# Patient Record
Sex: Female | Born: 1990 | Race: Black or African American | Hispanic: No | Marital: Single | Smoking: Former smoker
Health system: Southern US, Community
[De-identification: ages and names within clinical notes are randomized; demographics above are authoritative.]

## PROBLEM LIST (undated history)

## (undated) ENCOUNTER — Inpatient Hospital Stay (HOSPITAL_COMMUNITY): Payer: Self-pay

## (undated) DIAGNOSIS — R51 Headache: Secondary | ICD-10-CM

## (undated) DIAGNOSIS — G43909 Migraine, unspecified, not intractable, without status migrainosus: Secondary | ICD-10-CM

## (undated) DIAGNOSIS — R011 Cardiac murmur, unspecified: Secondary | ICD-10-CM

## (undated) DIAGNOSIS — F32A Depression, unspecified: Secondary | ICD-10-CM

## (undated) DIAGNOSIS — D649 Anemia, unspecified: Secondary | ICD-10-CM

## (undated) DIAGNOSIS — F329 Major depressive disorder, single episode, unspecified: Secondary | ICD-10-CM

## (undated) DIAGNOSIS — A599 Trichomoniasis, unspecified: Secondary | ICD-10-CM

## (undated) HISTORY — PX: INDUCED ABORTION: SHX677

---
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
## 2010-07-17 ENCOUNTER — Emergency Department (HOSPITAL_COMMUNITY): Admission: EM | Admit: 2010-07-17 | Discharge: 2010-07-17 | Payer: Self-pay | Admitting: Emergency Medicine

## 2010-12-31 ENCOUNTER — Inpatient Hospital Stay (INDEPENDENT_AMBULATORY_CARE_PROVIDER_SITE_OTHER)
Admission: RE | Admit: 2010-12-31 | Discharge: 2010-12-31 | Disposition: A | Discharge status: Home / Self Care | Payer: Medicaid Other | Source: Ambulatory Visit | Attending: Family Medicine | Admitting: Family Medicine

## 2010-12-31 DIAGNOSIS — N898 Other specified noninflammatory disorders of vagina: Secondary | ICD-10-CM

## 2010-12-31 LAB — POCT URINALYSIS DIPSTICK
Glucose, UA: NEGATIVE mg/dL
Specific Gravity, Urine: >=1.03 (ref 1.005–1.030)
Urobilinogen, UA: 1 mg/dL (ref 0.0–1.0)

## 2010-12-31 LAB — POCT PREGNANCY, URINE: Preg Test, Ur: NEGATIVE

## 2010-12-31 LAB — POCT I-STAT, CHEM 8
BUN: 11 mg/dL (ref 6–23)
Creatinine, Ser: 0.8 mg/dL (ref 0.4–1.2)
Hemoglobin: 13.3 g/dL (ref 12.0–15.0)
Potassium: 3.2 mEq/L — ABNORMAL LOW (ref 3.5–5.1)
Sodium: 139 mEq/L (ref 135–145)
TCO2: 23 mmol/L (ref 0–100)

## 2011-04-19 ENCOUNTER — Inpatient Hospital Stay (INDEPENDENT_AMBULATORY_CARE_PROVIDER_SITE_OTHER)
Admission: RE | Admit: 2011-04-19 | Discharge: 2011-04-19 | Disposition: A | Discharge status: Home / Self Care | Payer: Self-pay | Source: Ambulatory Visit | Attending: Emergency Medicine | Admitting: Emergency Medicine

## 2011-04-19 DIAGNOSIS — J45909 Unspecified asthma, uncomplicated: Secondary | ICD-10-CM

## 2011-11-06 ENCOUNTER — Encounter (HOSPITAL_COMMUNITY): Payer: Self-pay | Admitting: *Deleted

## 2011-11-06 ENCOUNTER — Inpatient Hospital Stay (HOSPITAL_COMMUNITY)
Admission: AD | Admit: 2011-11-06 | Discharge: 2011-11-06 | Disposition: A | Discharge status: Home / Self Care | Payer: Self-pay | Source: Ambulatory Visit | Attending: Obstetrics & Gynecology | Admitting: Obstetrics & Gynecology

## 2011-11-06 ENCOUNTER — Inpatient Hospital Stay (HOSPITAL_COMMUNITY): Payer: Self-pay

## 2011-11-06 DIAGNOSIS — O360990 Maternal care for other rhesus isoimmunization, unspecified trimester, not applicable or unspecified: Secondary | ICD-10-CM

## 2011-11-06 DIAGNOSIS — O209 Hemorrhage in early pregnancy, unspecified: Secondary | ICD-10-CM | POA: Insufficient documentation

## 2011-11-06 DIAGNOSIS — Z298 Encounter for other specified prophylactic measures: Secondary | ICD-10-CM | POA: Insufficient documentation

## 2011-11-06 DIAGNOSIS — Z2989 Encounter for other specified prophylactic measures: Secondary | ICD-10-CM | POA: Insufficient documentation

## 2011-11-06 DIAGNOSIS — Z6791 Unspecified blood type, Rh negative: Secondary | ICD-10-CM

## 2011-11-06 HISTORY — DX: Cardiac murmur, unspecified: R01.1

## 2011-11-06 HISTORY — DX: Major depressive disorder, single episode, unspecified: F32.9

## 2011-11-06 HISTORY — DX: Depression, unspecified: F32.A

## 2011-11-06 HISTORY — DX: Anemia, unspecified: D64.9

## 2011-11-06 HISTORY — DX: Headache: R51

## 2011-11-06 LAB — URINALYSIS, ROUTINE W REFLEX MICROSCOPIC
Hgb urine dipstick: NEGATIVE
Leukocytes, UA: NEGATIVE
Protein, ur: NEGATIVE mg/dL
Specific Gravity, Urine: >1.03 — ABNORMAL HIGH (ref 1.005–1.030)
Urobilinogen, UA: 1 mg/dL (ref 0.0–1.0)

## 2011-11-06 LAB — RH IG WORKUP (INCLUDES ABO/RH)
ABO/RH(D): AB NEG
Antibody Screen: NEGATIVE
Gestational Age(Wks): 5
Unit division: 0

## 2011-11-06 LAB — POCT PREGNANCY, URINE: Preg Test, Ur: POSITIVE

## 2011-11-06 LAB — CBC
MCHC: 34 g/dL (ref 30.0–36.0)
Platelets: 201 10*3/uL (ref 150–400)
RDW: 13 % (ref 11.5–15.5)
WBC: 5.1 10*3/uL (ref 4.0–10.5)

## 2011-11-06 LAB — HCG, QUANTITATIVE, PREGNANCY: hCG, Beta Chain, Quant, S: 4532 m[IU]/mL — ABNORMAL HIGH (ref <5)

## 2011-11-06 LAB — WET PREP, GENITAL: Yeast Wet Prep HPF POC: NONE SEEN

## 2011-11-06 LAB — ABO/RH: ABO/RH(D): AB NEG

## 2011-11-06 MED ORDER — RHO D IMMUNE GLOBULIN 1500 UNIT/2ML IJ SOLN
300.0000 ug | Freq: Once | INTRAMUSCULAR | Status: AC
  Administered 2011-11-06: 300 ug via INTRAMUSCULAR
  Filled 2011-11-06: qty 2

## 2011-11-06 NOTE — ED Notes (Signed)
No problem with injection

## 2011-11-06 NOTE — ED Provider Notes (Signed)
Attestation of Attending Supervision of Advanced Practitioner: Evaluation and management procedures were performed by the PA/NP/CNM/OB Fellow under my supervision/collaboration. Chart reviewed, and agree with management and plan.  Elwanda Moger, M.D. 11/06/2011 3:35 PM   

## 2011-11-06 NOTE — ED Notes (Signed)
rhophyllac handouts given and discussed with pt and S.O.

## 2011-11-06 NOTE — ED Provider Notes (Signed)
History     Chief Complaint  Patient presents with  . Dysmenorrhea   HPI Pt is early pregnant~4weeks with LMP 12/14 after 3 positive home pregnancy tests.  She had intercourse last night and spotting this morning with some mild cramping.  She denies other complaints. She is G2P0TAB1 with Rh negative- pt states she had a "shot" with her TAB.  Pt denies other vaginal discharge, constipation, diarrhea, nausea, vomiting, chills fever or headaches.    No past medical history on file.  History reviewed. No pertinent past surgical history.  No family history on file.  History  Substance Use Topics  . Smoking status: Not on file  . Smokeless tobacco: Not on file  . Alcohol Use: Not on file    Allergies: Allergies not on file  No prescriptions prior to admission    Review of Systems  Constitutional: Negative for fever and chills.  Respiratory: Negative for cough.   Gastrointestinal: Positive for abdominal pain. Negative for nausea, vomiting, diarrhea and constipation.  Genitourinary: Negative for dysuria and urgency.  Skin: Negative for rash.  Neurological: Negative for headaches.   Physical Exam   Blood pressure 108/66, pulse 72, temperature 98.7 F (37.1 C), temperature source Oral, resp. rate 16, height 5' 6.25" (1.683 m), weight 149 lb 4 oz (67.699 kg), last menstrual period 10/09/2011.  Physical Exam  Vitals reviewed. Constitutional: She is oriented to person, place, and time. She appears well-developed and well-nourished.  HENT:  Head: Normocephalic.  Eyes: Pupils are equal, round, and reactive to light.  Neck: Normal range of motion. Neck supple.  Cardiovascular: Normal rate.   Respiratory: Effort normal.  GI: Soft. She exhibits no distension. There is no tenderness. There is no rebound and no guarding.  Genitourinary:       Mod amount of opaque pink discharge in vault; cervix clean, closed; uterus ant deviated to right NSSC; adnexal without palpable enlargement or  tenderness  Musculoskeletal: Normal range of motion.  Neurological: She is alert and oriented to person, place, and time.  Skin: Skin is warm and dry.  Psychiatric: She has a normal mood and affect.    MAU Course  Procedures Results for orders placed during the hospital encounter of 11/06/11 (from the past 24 hour(s))  URINALYSIS, ROUTINE W REFLEX MICROSCOPIC     Status: Abnormal   Collection Time   11/06/11 11:43 AM      Component Value Range   Color, Urine YELLOW  YELLOW    APPearance CLEAR  CLEAR    Specific Gravity, Urine >1.030 (*) 1.005 - 1.030    pH 6.0  5.0 - 8.0    Glucose, UA NEGATIVE  NEGATIVE (mg/dL)   Hgb urine dipstick NEGATIVE  NEGATIVE    Bilirubin Urine NEGATIVE  NEGATIVE    Ketones, ur NEGATIVE  NEGATIVE (mg/dL)   Protein, ur NEGATIVE  NEGATIVE (mg/dL)   Urobilinogen, UA 1.0  0.0 - 1.0 (mg/dL)   Nitrite NEGATIVE  NEGATIVE    Leukocytes, UA NEGATIVE  NEGATIVE   POCT PREGNANCY, URINE     Status: Normal   Collection Time   11/06/11 11:54 AM      Component Value Range   Preg Test, Ur POSITIVE    CBC     Status: Abnormal   Collection Time   11/06/11 12:00 PM      Component Value Range   WBC 5.1  4.0 - 10.5 (K/uL)   RBC 4.10  3.87 - 5.11 (MIL/uL)   Hemoglobin 12.1  12.0 - 15.0 (g/dL)   HCT 16.1 (*) 09.6 - 46.0 (%)   MCV 86.8  78.0 - 100.0 (fL)   MCH 29.5  26.0 - 34.0 (pg)   MCHC 34.0  30.0 - 36.0 (g/dL)   RDW 04.5  40.9 - 81.1 (%)   Platelets 201  150 - 400 (K/uL)  ABO/RH     Status: Normal   Collection Time   11/06/11 12:00 PM      Component Value Range   ABO/RH(D) AB NEG    HCG, QUANTITATIVE, PREGNANCY     Status: Abnormal   Collection Time   11/06/11 12:00 PM      Component Value Range   hCG, Beta Chain, Quant, S 4532 (*) <5 (mIU/mL)  RH IG WORKUP, [facility]     Status: Normal (Preliminary result)   Collection Time   11/06/11 12:10 PM      Component Value Range   Gestational Age(Wks) 5     ABO/RH(D) AB NEG     Antibody Screen NEG     Unit  Number [id number]     Blood Component Type RHIG     Unit division 00     Status of Unit ISSUED     Transfusion Status OK TO TRANSFUSE    WET PREP, GENITAL     Status: Abnormal   Collection Time   11/06/11 12:50 PM      Component Value Range   Yeast, Wet Prep NONE SEEN  NONE SEEN    Trich, Wet Prep NONE SEEN  NONE SEEN    Clue Cells, Wet Prep NONE SEEN  NONE SEEN    WBC, Wet Prep HPF POC FEW (*) NONE SEEN   ultrasound showed [redacted]w[redacted]d GS with YS no FP yet Rh negative- Rhogam given   Assessment and Plan  Bleeding in early pregnancy Rh Neg with RHogam given F/u for prenatal care  Salik Grewell 11/06/2011, 12:53 PM

## 2011-11-06 NOTE — Progress Notes (Signed)
Period is about due, had some cramping and spotting this morning, had done  Home preg tests last Sat, Sun and today - all were positive, wondering if positive

## 2011-11-06 NOTE — Progress Notes (Signed)
Pt states here for pregnancy confirmation, noted spotting today. Had 2 positive upt's at home.

## 2012-11-23 ENCOUNTER — Encounter (HOSPITAL_COMMUNITY): Payer: Self-pay | Admitting: Family Medicine

## 2012-11-23 ENCOUNTER — Emergency Department (HOSPITAL_COMMUNITY)
Admission: EM | Admit: 2012-11-23 | Discharge: 2012-11-23 | Disposition: A | Discharge status: Home / Self Care | Payer: Self-pay | Attending: Emergency Medicine | Admitting: Emergency Medicine

## 2012-11-23 DIAGNOSIS — J45909 Unspecified asthma, uncomplicated: Secondary | ICD-10-CM | POA: Insufficient documentation

## 2012-11-23 DIAGNOSIS — S335XXA Sprain of ligaments of lumbar spine, initial encounter: Secondary | ICD-10-CM | POA: Insufficient documentation

## 2012-11-23 DIAGNOSIS — S39012A Strain of muscle, fascia and tendon of lower back, initial encounter: Secondary | ICD-10-CM

## 2012-11-23 DIAGNOSIS — Z87891 Personal history of nicotine dependence: Secondary | ICD-10-CM | POA: Insufficient documentation

## 2012-11-23 DIAGNOSIS — Z862 Personal history of diseases of the blood and blood-forming organs and certain disorders involving the immune mechanism: Secondary | ICD-10-CM | POA: Insufficient documentation

## 2012-11-23 DIAGNOSIS — R011 Cardiac murmur, unspecified: Secondary | ICD-10-CM | POA: Insufficient documentation

## 2012-11-23 DIAGNOSIS — Y9389 Activity, other specified: Secondary | ICD-10-CM | POA: Insufficient documentation

## 2012-11-23 DIAGNOSIS — Y92410 Unspecified street and highway as the place of occurrence of the external cause: Secondary | ICD-10-CM | POA: Insufficient documentation

## 2012-11-23 DIAGNOSIS — Z8659 Personal history of other mental and behavioral disorders: Secondary | ICD-10-CM | POA: Insufficient documentation

## 2012-11-23 DIAGNOSIS — Z8669 Personal history of other diseases of the nervous system and sense organs: Secondary | ICD-10-CM | POA: Insufficient documentation

## 2012-11-23 DIAGNOSIS — Z79899 Other long term (current) drug therapy: Secondary | ICD-10-CM | POA: Insufficient documentation

## 2012-11-23 MED ORDER — IBUPROFEN 400 MG PO TABS
800.0000 mg | ORAL_TABLET | Freq: Once | ORAL | Status: AC
  Administered 2012-11-23: 800 mg via ORAL
  Filled 2012-11-23: qty 2

## 2012-11-23 MED ORDER — IBUPROFEN 800 MG PO TABS: 800.0000 mg | ORAL_TABLET | Freq: Three times a day (TID) | ORAL | Status: DC

## 2012-11-23 MED ORDER — CYCLOBENZAPRINE HCL 10 MG PO TABS
10.0000 mg | ORAL_TABLET | Freq: Once | ORAL | Status: AC
  Administered 2012-11-23: 10 mg via ORAL
  Filled 2012-11-23: qty 1

## 2012-11-23 MED ORDER — CYCLOBENZAPRINE HCL 10 MG PO TABS: 10.0000 mg | ORAL_TABLET | Freq: Two times a day (BID) | ORAL | Status: DC | PRN
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
## 2012-11-23 NOTE — ED Provider Notes (Signed)
Medical screening examination/treatment/procedure(s) were performed by non-physician practitioner and as supervising physician I was immediately available for consultation/collaboration.   Glynn Octave, MD 11/23/12 1859
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
## 2013-10-20 ENCOUNTER — Encounter (HOSPITAL_COMMUNITY): Payer: Self-pay | Admitting: *Deleted

## 2013-10-20 ENCOUNTER — Inpatient Hospital Stay (HOSPITAL_COMMUNITY)
Admission: AD | Admit: 2013-10-20 | Discharge: 2013-10-20 | Disposition: A | Discharge status: Home / Self Care | Payer: BC Managed Care – PPO | Source: Ambulatory Visit | Attending: Obstetrics & Gynecology | Admitting: Obstetrics & Gynecology

## 2013-10-20 DIAGNOSIS — Z87898 Personal history of other specified conditions: Secondary | ICD-10-CM

## 2013-10-20 DIAGNOSIS — Z3202 Encounter for pregnancy test, result negative: Secondary | ICD-10-CM | POA: Insufficient documentation

## 2013-10-20 LAB — CBC WITH DIFFERENTIAL/PLATELET
Basophils Absolute: 0 10*3/uL (ref 0.0–0.1)
HCT: 35.7 % — ABNORMAL LOW (ref 36.0–46.0)
Hemoglobin: 12.1 g/dL (ref 12.0–15.0)
Lymphocytes Relative: 41 % (ref 12–46)
Lymphs Abs: 1.9 10*3/uL (ref 0.7–4.0)
MCV: 85.8 fL (ref 78.0–100.0)
Neutro Abs: 2.1 10*3/uL (ref 1.7–7.7)
Neutrophils Relative %: 45 % (ref 43–77)
Platelets: 195 10*3/uL (ref 150–400)

## 2013-10-20 LAB — URINALYSIS, ROUTINE W REFLEX MICROSCOPIC
Bilirubin Urine: NEGATIVE
Glucose, UA: NEGATIVE mg/dL
Hgb urine dipstick: NEGATIVE
Ketones, ur: NEGATIVE mg/dL
Leukocytes, UA: NEGATIVE
pH: 6 (ref 5.0–8.0)

## 2013-10-20 MED ORDER — PROMETHAZINE HCL 25 MG PO TABS: 25.0000 mg | ORAL_TABLET | Freq: Four times a day (QID) | ORAL | Status: DC | PRN

## 2013-10-20 NOTE — MAU Provider Note (Signed)
History     CSN: [id number]  Arrival date and time: 10/20/13 1734   First Provider Initiated Contact with Patient 10/20/13 1851      Chief Complaint  Patient presents with  . Possible Pregnancy  . Nausea   HPI This is a 22 y.o. female who presents with request for pregnancy test. She had two tests positive at home and wants to be sure.  Vomited what she ate 2 days ago and once yesterday. Has eaten once today and did not vomit that.   RN Note:  Patient states she has had two positive home pregnancy tests and wants confirmation. Denies any pain, bleeding, no problems  OB History   Grav Para Term Preterm Abortions TAB SAB Ect Mult Living   2 0 0 0 2 1 1 0 0 0       Past Medical History  Diagnosis Date  . Asthma   . Headache(784.0)     when on birth control  . Heart murmur   . Anemia   . Depression     never on meds, doing ok    Past Surgical History  Procedure Laterality Date  . Induced abortion      Family History  Problem Relation Age of Onset  . Anesthesia problems Neg Hx     History  Substance Use Topics  . Smoking status: Current Every Day Smoker -- 0.25 packs/day    Last Attempt to Quit: 10/27/2011  . Smokeless tobacco: Never Used  . Alcohol Use: Yes     Comment: monthly    Allergies:  Allergies  Allergen Reactions  . Latex Swelling and Rash    Prescriptions prior to admission  Medication Sig Dispense Refill  . albuterol (PROVENTIL HFA;VENTOLIN HFA) 108 (90 BASE) MCG/ACT inhaler Inhale 2 puffs into the lungs every 6 (six) hours as needed. For shortness of breath/asthma      . cyclobenzaprine (FLEXERIL) 10 MG tablet Take 1 tablet (10 mg total) by mouth 2 (two) times daily as needed for muscle spasms.  20 tablet  0  . ibuprofen (ADVIL,MOTRIN) 200 MG tablet Take 400 mg by mouth every 6 (six) hours as needed. For pain      . ibuprofen (ADVIL,MOTRIN) 800 MG tablet Take 1 tablet (800 mg total) by mouth 3 (three) times daily.  21 tablet  0    Review  of Systems  Constitutional: Negative for fever, chills and malaise/fatigue.  Gastrointestinal: Positive for vomiting (no vomiting today, no nausea inbetween vomiting episodes). Negative for nausea, abdominal pain, diarrhea and constipation.  Genitourinary: Negative for dysuria.  Neurological: Negative for dizziness and weakness.   Physical Exam   Blood pressure 115/77, pulse 66, temperature 99.1 F (37.3 C), temperature source Oral, resp. rate 16, height 5' 4.5" (1.638 m), weight 69.854 kg (154 lb), last menstrual period 09/17/2013, SpO2 100.00%.  Physical Exam  Constitutional: She is oriented to person, place, and time. She appears well-developed and well-nourished. No distress.  Cardiovascular: Normal rate.   Respiratory: Effort normal.  GI: Soft. She exhibits no distension. There is no tenderness. There is no rebound and no guarding.  Genitourinary:  Pelvic declined. Has no pain, not worried about STDs. No bleeding   Musculoskeletal: Normal range of motion.  Neurological: She is alert and oriented to person, place, and time.  Skin: Skin is warm and dry.  Psychiatric: She has a normal mood and affect.   Results for orders placed during the hospital encounter of 10/20/13 (from the past 72 hour(s))  URINALYSIS, ROUTINE W REFLEX MICROSCOPIC     Status: None   Collection Time    10/20/13  6:15 PM      Result Value Range   Color, Urine YELLOW  YELLOW   APPearance CLEAR  CLEAR   Specific Gravity, Urine 1.025  1.005 - 1.030   pH 6.0  5.0 - 8.0   Glucose, UA NEGATIVE  NEGATIVE mg/dL   Hgb urine dipstick NEGATIVE  NEGATIVE   Bilirubin Urine NEGATIVE  NEGATIVE   Ketones, ur NEGATIVE  NEGATIVE mg/dL   Protein, ur NEGATIVE  NEGATIVE mg/dL   Urobilinogen, UA 0.2  0.0 - 1.0 mg/dL   Nitrite NEGATIVE  NEGATIVE   Leukocytes, UA NEGATIVE  NEGATIVE   Comment: MICROSCOPIC NOT DONE ON URINES WITH NEGATIVE PROTEIN, BLOOD, LEUKOCYTES, NITRITE, OR GLUCOSE <1000 mg/dL.  POCT PREGNANCY, URINE      Status: None   Collection Time    10/20/13  6:27 PM      Result Value Range   Preg Test, Ur NEGATIVE  NEGATIVE   Comment:            THE SENSITIVITY OF THIS     METHODOLOGY IS >24 mIU/mL  HCG, QUANTITATIVE, PREGNANCY     Status: None   Collection Time    10/20/13  6:55 PM      Result Value Range   hCG, Beta Chain, Quant, S <1  <5 mIU/mL   Comment:              GEST. AGE      CONC.  (mIU/mL)       <=1 WEEK        5 - 50         2 WEEKS       50 - 500         3 WEEKS       100 - 10,000         4 WEEKS     1,000 - 30,000         5 WEEKS     3,500 - 115,000       6-8 WEEKS     12,000 - 270,000        12 WEEKS     15,000 - 220,000                FEMALE AND NON-PREGNANT FEMALE:         LESS THAN 5 mIU/mL     REPEATED TO VERIFY  CBC WITH DIFFERENTIAL     Status: Abnormal   Collection Time    10/20/13  6:55 PM      Result Value Range   WBC 4.6  4.0 - 10.5 K/uL   RBC 4.16  3.87 - 5.11 MIL/uL   Hemoglobin 12.1  12.0 - 15.0 g/dL   HCT 13.2 (*) 44.0 - 10.2 %   MCV 85.8  78.0 - 100.0 fL   MCH 29.1  26.0 - 34.0 pg   MCHC 33.9  30.0 - 36.0 g/dL   RDW 72.5  36.6 - 44.0 %   Platelets 195  150 - 400 K/uL   Neutrophils Relative % 45  43 - 77 %   Neutro Abs 2.1  1.7 - 7.7 K/uL   Lymphocytes Relative 41  12 - 46 %   Lymphs Abs 1.9  0.7 - 4.0 K/uL   Monocytes Relative 13 (*) 3 - 12 %   Monocytes Absolute 0.6  0.1 - 1.0  K/uL   Eosinophils Relative 1  0 - 5 %   Eosinophils Absolute 0.0  0.0 - 0.7 K/uL   Basophils Relative 1  0 - 1 %   Basophils Absolute 0.0  0.0 - 0.1 K/uL    MAU Course  Procedures  MDM Quant HCG drawn  >> less than 1  Assessment and Plan  A:  Pregnancy test Negative  P:  Discharge home      Wants to go to [facility] (has [employer]BS) for GYN care, number given      Rx Promethazine   Medication List         albuterol 108 (90 BASE) MCG/ACT inhaler  Commonly known as:  PROVENTIL HFA;VENTOLIN HFA  Inhale 2 puffs into the lungs every 6 (six) hours as needed. For  shortness of breath/asthma     ibuprofen 200 MG tablet  Commonly known as:  ADVIL,MOTRIN  Take 400 mg by mouth every 6 (six) hours as needed for headache. For pain     promethazine 25 MG tablet  Commonly known as:  PHENERGAN  Take 1 tablet (25 mg total) by mouth every 6 (six) hours as needed for nausea or vomiting.         Wynelle Bourgeois 10/20/2013, 7:05 PM

## 2013-10-20 NOTE — MAU Note (Signed)
Has had vomiting for the past 2 days

## 2013-10-20 NOTE — MAU Note (Signed)
Patient states she has had two positive home pregnancy tests and wants confirmation. Denies any pain, bleeding, no problems.

## 2013-10-23 NOTE — MAU Provider Note (Signed)
Attestation of Attending Supervision of Advanced Practitioner (CNM/NP): Evaluation and management procedures were performed by the Advanced Practitioner under my supervision and collaboration.  I have reviewed the Advanced Practitioner's note and chart, and I agree with the management and plan.  HARRAWAY-SMITH, Ciara Kagan 4:34 PM     

## 2014-02-09 ENCOUNTER — Emergency Department (HOSPITAL_COMMUNITY): Payer: BC Managed Care – PPO

## 2014-02-09 ENCOUNTER — Encounter (HOSPITAL_COMMUNITY): Payer: Self-pay | Admitting: Emergency Medicine

## 2014-02-09 ENCOUNTER — Emergency Department (HOSPITAL_COMMUNITY)
Admission: EM | Admit: 2014-02-09 | Discharge: 2014-02-09 | Disposition: A | Discharge status: Home / Self Care | Payer: BC Managed Care – PPO | Attending: Emergency Medicine | Admitting: Emergency Medicine

## 2014-02-09 DIAGNOSIS — F17200 Nicotine dependence, unspecified, uncomplicated: Secondary | ICD-10-CM

## 2014-02-09 DIAGNOSIS — R011 Cardiac murmur, unspecified: Secondary | ICD-10-CM | POA: Insufficient documentation

## 2014-02-09 DIAGNOSIS — R05 Cough: Secondary | ICD-10-CM

## 2014-02-09 DIAGNOSIS — R058 Other specified cough: Secondary | ICD-10-CM

## 2014-02-09 DIAGNOSIS — J3489 Other specified disorders of nose and nasal sinuses: Secondary | ICD-10-CM | POA: Insufficient documentation

## 2014-02-09 DIAGNOSIS — R079 Chest pain, unspecified: Secondary | ICD-10-CM | POA: Insufficient documentation

## 2014-02-09 DIAGNOSIS — Z862 Personal history of diseases of the blood and blood-forming organs and certain disorders involving the immune mechanism: Secondary | ICD-10-CM | POA: Insufficient documentation

## 2014-02-09 DIAGNOSIS — R059 Cough, unspecified: Secondary | ICD-10-CM | POA: Insufficient documentation

## 2014-02-09 DIAGNOSIS — R1110 Vomiting, unspecified: Secondary | ICD-10-CM | POA: Insufficient documentation

## 2014-02-09 DIAGNOSIS — J45909 Unspecified asthma, uncomplicated: Secondary | ICD-10-CM | POA: Insufficient documentation

## 2014-02-09 DIAGNOSIS — R51 Headache: Secondary | ICD-10-CM | POA: Insufficient documentation

## 2014-02-09 DIAGNOSIS — R52 Pain, unspecified: Secondary | ICD-10-CM | POA: Insufficient documentation

## 2014-02-09 DIAGNOSIS — Z8659 Personal history of other mental and behavioral disorders: Secondary | ICD-10-CM | POA: Insufficient documentation

## 2014-02-09 DIAGNOSIS — Z91040 Latex allergy status: Secondary | ICD-10-CM | POA: Insufficient documentation

## 2014-02-09 MED ORDER — TRAMADOL HCL 50 MG PO TABS: 50.0000 mg | ORAL_TABLET | Freq: Four times a day (QID) | ORAL | Status: DC | PRN

## 2014-02-09 MED ORDER — BENZONATATE 100 MG PO CAPS: 100.0000 mg | ORAL_CAPSULE | Freq: Three times a day (TID) | ORAL | Status: DC

## 2014-02-09 MED ORDER — DM-GUAIFENESIN ER 30-600 MG PO TB12: 1.0000 | ORAL_TABLET | Freq: Two times a day (BID) | ORAL | Status: DC

## 2014-02-09 NOTE — ED Notes (Addendum)
Coughing up blood tinged sputum that started this past Tuesday; was just coughing up yellowish/brownish sputum. Bilateral lower rib pain when coughing.

## 2014-02-09 NOTE — ED Provider Notes (Signed)
CSN: [id number]     Arrival date & time 02/09/14  0530 History   First MD Initiated Contact with Patient 02/09/14 [phone]     Chief Complaint  Patient presents with  . Hemoptysis     (Consider location/radiation/quality/duration/timing/severity/associated sxs/prior Treatment) HPI Pt is a 23yo female with hx of asthma presenting to ED c/o productive cough that has been worsening since Tuesday, 4/14, with hemoptysis. Pt states she has had a cough on and off for last 2 months. Pt is a smoker, states 1-2 cigarrettes per day, denies marijuana use.  Pt also c/o body aches including bilateral rib pain, 7/10.  Has tried Nyquil and ibuprofen with minimal relief.  Pt denies having to use albuterol inhaler more frequently.  Denies fever, n/v/d.  Past Medical History  Diagnosis Date  . Asthma   . Headache(784.0)     when on birth control  . Heart murmur   . Anemia   . Depression     never on meds, doing ok   Past Surgical History  Procedure Laterality Date  . Induced abortion     Family History  Problem Relation Age of Onset  . Anesthesia problems Neg Hx    History  Substance Use Topics  . Smoking status: Current Every Day Smoker -- 0.25 packs/day    Last Attempt to Quit: 10/27/2011  . Smokeless tobacco: Never Used  . Alcohol Use: Yes     Comment: monthly   OB History   Grav Para Term Preterm Abortions TAB SAB Ect Mult Living   2 0 0 0 2 1 1 0 0 0      Review of Systems  Constitutional: Negative for fever, chills, diaphoresis, fatigue and unexpected weight change.  HENT: Positive for congestion and sore throat.   Respiratory: Positive for cough. Negative for shortness of breath.   Cardiovascular: Positive for chest pain ( bilateral rib pain). Negative for palpitations.  Gastrointestinal: Positive for vomiting ( post-tussive). Negative for nausea, abdominal pain and diarrhea.  Musculoskeletal: Negative for back pain, neck pain and neck stiffness.  Skin: Negative for rash.   Neurological: Positive for headaches.  All other systems reviewed and are negative.     Allergies  Latex  Home Medications   Prior to Admission medications   Not on File   BP 97/73  Pulse 74  Temp(Src) 98.6 F (37 C) (Oral)  Resp 22  SpO2 99% Physical Exam  Nursing note and vitals reviewed. Constitutional: She appears well-developed and well-nourished. No distress.  HENT:  Head: Normocephalic and atraumatic.  Right Ear: Hearing, tympanic membrane, external ear and ear canal normal.  Left Ear: Hearing, tympanic membrane, external ear and ear canal normal.  Nose: Mucosal edema present.  Mouth/Throat: Uvula is midline and mucous membranes are normal. Posterior oropharyngeal erythema present. No oropharyngeal exudate, posterior oropharyngeal edema or tonsillar abscesses.  Eyes: Conjunctivae are normal. No scleral icterus.  Neck: Normal range of motion.  Cardiovascular: Normal rate, regular rhythm and normal heart sounds.   Pulmonary/Chest: Effort normal and breath sounds normal. No respiratory distress. She has no wheezes. She has no rales. She exhibits no tenderness.  No respiratory distress, able to speak in full sentences w/o difficulty. Lungs: CTAB. Mild bilateral chest wall tenderness.   Abdominal: Soft. Bowel sounds are normal. She exhibits no distension and no mass. There is no tenderness. There is no rebound and no guarding.  Musculoskeletal: Normal range of motion.  Neurological: She is alert.  Skin: Skin is warm and dry. She  is not diaphoretic.    ED Course  Procedures (including critical care time) Labs Review Labs Reviewed - No data to display  Imaging Review Dg Chest 2 View  02/09/2014   CLINICAL DATA:  Shortness of breath  EXAM: CHEST  2 VIEW  COMPARISON:  DG CHEST 2 VIEW dated 07/17/2010  FINDINGS: The heart size and mediastinal contours are within normal limits. Both lungs are clear. The visualized skeletal structures are unremarkable.  IMPRESSION: No  active cardiopulmonary disease.   Electronically Signed   By: Elige KoHetal  Patel   On: 02/09/2014 06:38     EKG Interpretation None      MDM   Final diagnoses:  Recurrent productive cough  Smoker    Pt is a 23yo female with hx of asthma and active smoker presenting to ED c/o intermittent productive cough and hemoptysis that started 3 days ago.  Pt also c/o body aches and bilateral rib pain.  Not concerned for PE or ACS.  Pt appears well, non-toxic, no respiratory distress. Vitals: WNL.  Will tx symptomatically for cough and musculoskeletal pain. Rx: tessalon, mucinex DM, and tramadol.  Also encouraged plenty of rest and fluids. Discussed importance of smoking cessation.    Provided pt info for smoking cessation, cough, and cool mist vaporizer.  [facility]  Return precautions provided. Pt verbalized understanding and agreement with tx plan.    Junius FinnerErin O'Malley, PA-C 02/09/14 [phone]

## 2014-02-12 NOTE — ED Provider Notes (Signed)
Medical screening examination/treatment/procedure(s) were performed by non-physician practitioner and as supervising physician I was immediately available for consultation/collaboration.   EKG Interpretation None       Khayden Herzberg M Laportia Carley, MD 02/12/14 1118 

## 2014-08-27 ENCOUNTER — Encounter (HOSPITAL_COMMUNITY): Payer: Self-pay | Admitting: Emergency Medicine

## 2015-01-24 ENCOUNTER — Encounter (HOSPITAL_COMMUNITY): Payer: Self-pay | Admitting: Emergency Medicine

## 2015-01-24 ENCOUNTER — Emergency Department (INDEPENDENT_AMBULATORY_CARE_PROVIDER_SITE_OTHER)
Admission: EM | Admit: 2015-01-24 | Discharge: 2015-01-24 | Disposition: A | Discharge status: Home / Self Care | Payer: BLUE CROSS/BLUE SHIELD | Source: Home / Self Care | Attending: Family Medicine | Admitting: Family Medicine

## 2015-01-24 DIAGNOSIS — N912 Amenorrhea, unspecified: Secondary | ICD-10-CM | POA: Diagnosis not present

## 2015-01-24 LAB — POCT PREGNANCY, URINE: PREG TEST UR: NEGATIVE

## 2015-01-24 NOTE — ED Provider Notes (Signed)
Samantha Hardin is a 24 y.o. female who presents to Urgent Care today for amenorrhea. Patient's last menstrual period was in October when she received a Depo-Provera injection. She ate various is nauseous sometimes but otherwise feels pretty well. No fevers chills vomiting or diarrhea. She had a negative pregnancy test at home 3 weeks ago.   Past Medical History  Diagnosis Date  . Asthma   . Headache(784.0)     when on birth control  . Heart murmur   . Anemia   . Depression     never on meds, doing ok   Past Surgical History  Procedure Laterality Date  . Induced abortion     History  Substance Use Topics  . Smoking status: Current Every Day Smoker -- 0.25 packs/day    Last Attempt to Quit: 10/27/2011  . Smokeless tobacco: Never Used  . Alcohol Use: Yes     Comment: monthly   ROS as above Medications: No current facility-administered medications for this encounter.   Current Outpatient Prescriptions  Medication Sig Dispense Refill  . benzonatate (TESSALON) 100 MG capsule Take 1 capsule (100 mg total) by mouth every 8 (eight) hours. 21 capsule 0  . dextromethorphan-guaiFENesin (MUCINEX DM) 30-600 MG per 12 hr tablet Take 1 tablet by mouth 2 (two) times daily. Be sure to drink an 8oz glass of water with each dose 14 tablet 0  . traMADol (ULTRAM) 50 MG tablet Take 1 tablet (50 mg total) by mouth every 6 (six) hours as needed. 15 tablet 0   Allergies  Allergen Reactions  . Latex Swelling and Rash     Exam:  BP 130/64 mmHg  Pulse 78  Temp(Src) 97.9 F (36.6 C) (Oral)  Resp 18  SpO2 97% Gen: Well NAD HEENT: EOMI,  MMM Lungs: Normal work of breathing. CTABL Heart: RRR no MRG Abd: NABS, Soft. Nondistended, Nontender.   Results for orders placed or performed during the hospital encounter of 01/24/15 (from the past 24 hour(s))  Pregnancy, urine POC     Status: None   Collection Time: 01/24/15  6:11 PM  Result Value Ref Range   Preg Test, Ur NEGATIVE NEGATIVE   No  results found.  Assessment and Plan: 24 y.o. female with amenorrhea. Likely due to Depo-Provera. Recommend using effective birth control and follow-up with OB/GYN.  Discussed warning signs or symptoms. Please see discharge instructions. Patient expresses understanding.     Rodolph BongEvan S Corey, MD 01/24/15 [phone]

## 2015-01-24 NOTE — Discharge Instructions (Signed)
Thank you for coming in today. Follow up with OBGYN as needed.  Use condoms to prevent pregnancy.  Return as needed.     Secondary Amenorrhea  Secondary amenorrhea is the stopping of menstrual flow for 3-6 months in a female who has previously had periods. There are many possible causes. Most of these causes are not serious. Usually, treating the underlying problem causing the loss of menses will return your periods to normal. CAUSES  Some common and uncommon causes of not menstruating include:  Malnutrition.  Low blood sugar (hypoglycemia).  Polycystic ovary disease.  Stress or fear.  Breastfeeding.  Hormone imbalance.  Ovarian failure.  Medicines.  Extreme obesity.  Cystic fibrosis.  Low body weight or drastic weight reduction from any cause.  Early menopause.  Removal of ovaries or uterus.  Contraceptives.  Illness.  Long-term (chronic) illnesses.  Cushing syndrome.  Thyroid problems.  Birth control pills, patches, or vaginal rings for birth control. RISK FACTORS You may be at greater risk of secondary amenorrhea if:  You have a family history of this condition.  You have an eating disorder.  You do athletic training. DIAGNOSIS  A diagnosis is made by your health care provider taking a medical history and doing a physical exam. This will include a pelvic exam to check for problems with your reproductive organs. Pregnancy must be ruled out. Often, numerous blood tests are done to measure different hormones in the body. Urine testing may be done. Specialized exams (ultrasound, CT scan, MRI, or hysteroscopy) may have to be done as well as measuring the body mass index (BMI). TREATMENT  Treatment depends on the cause of the amenorrhea. If an eating disorder is present, this can be treated with an adequate diet and therapy. Chronic illnesses may improve with treatment of the illness. Amenorrhea may be corrected with medicines, lifestyle changes, or surgery.  If the amenorrhea cannot be corrected, it is sometimes possible to create a false menstruation with medicines. HOME CARE INSTRUCTIONS  Maintain a healthy diet.  Manage weight problems.  Exercise regularly but not excessively.  Get adequate sleep.  Manage stress.  Be aware of changes in your menstrual cycle. Keep a record of when your periods occur. Note the date your period starts, how long it lasts, and any problems. SEEK MEDICAL CARE IF: Your symptoms do not get better with treatment. Document Released: 11/23/2006 Document Revised: 06/14/2013 Document Reviewed: 03/30/2013 [facility] Patient Information 2015 [employer] [place]. This information is not intended to replace advice given to you by your health care provider. Make sure you discuss any questions you have with your health care provider.  [facility] [address] [place], [place] [phone]  [facility] [address] [place], [place] [phone]) [phone]  [facility]: Silverio Layivard Sandra MD [address] [place], [place] [phone]  [facility] [address] [place], [place] [phone]  [facility]: Marcelle OverlieGrewal Michelle MD [address] [place], [place] [phone]  [facility]: Tracey HarriesHenley Thomas F MD [address] [place], [place] [phone]  [facility] [address] [place], [place] [phone]   [facility]: [address], [place], [place] [address] [phone]

## 2015-01-24 NOTE — ED Notes (Signed)
Reports "I think I may be pregnant".  Pt is unsure of last menstrual cycle.  Pt states that she stopped [facility] in December.

## 2015-08-21 ENCOUNTER — Emergency Department (HOSPITAL_COMMUNITY)
Admission: EM | Admit: 2015-08-21 | Discharge: 2015-08-22 | Disposition: A | Discharge status: Home / Self Care | Payer: BLUE CROSS/BLUE SHIELD | Attending: Emergency Medicine | Admitting: Emergency Medicine

## 2015-08-21 ENCOUNTER — Encounter (HOSPITAL_COMMUNITY): Payer: Self-pay | Admitting: Emergency Medicine

## 2015-08-21 DIAGNOSIS — Z3202 Encounter for pregnancy test, result negative: Secondary | ICD-10-CM | POA: Insufficient documentation

## 2015-08-21 DIAGNOSIS — R011 Cardiac murmur, unspecified: Secondary | ICD-10-CM | POA: Insufficient documentation

## 2015-08-21 DIAGNOSIS — F32A Depression, unspecified: Secondary | ICD-10-CM

## 2015-08-21 DIAGNOSIS — Z720 Tobacco use: Secondary | ICD-10-CM | POA: Insufficient documentation

## 2015-08-21 DIAGNOSIS — R51 Headache: Secondary | ICD-10-CM | POA: Insufficient documentation

## 2015-08-21 DIAGNOSIS — Z862 Personal history of diseases of the blood and blood-forming organs and certain disorders involving the immune mechanism: Secondary | ICD-10-CM | POA: Insufficient documentation

## 2015-08-21 DIAGNOSIS — J45909 Unspecified asthma, uncomplicated: Secondary | ICD-10-CM | POA: Insufficient documentation

## 2015-08-21 DIAGNOSIS — R45851 Suicidal ideations: Secondary | ICD-10-CM

## 2015-08-21 DIAGNOSIS — Z91040 Latex allergy status: Secondary | ICD-10-CM | POA: Insufficient documentation

## 2015-08-21 DIAGNOSIS — F329 Major depressive disorder, single episode, unspecified: Secondary | ICD-10-CM | POA: Insufficient documentation

## 2015-08-21 DIAGNOSIS — F1210 Cannabis abuse, uncomplicated: Secondary | ICD-10-CM | POA: Insufficient documentation

## 2015-08-21 LAB — RAPID URINE DRUG SCREEN, HOSP PERFORMED
AMPHETAMINES: NOT DETECTED
Barbiturates: NOT DETECTED
Benzodiazepines: NOT DETECTED
Cocaine: NOT DETECTED
Opiates: NOT DETECTED
TETRAHYDROCANNABINOL: POSITIVE — AB

## 2015-08-21 LAB — I-STAT BETA HCG BLOOD, ED (MC, WL, AP ONLY)

## 2015-08-21 LAB — CBC
HCT: 37.9 % (ref 36.0–46.0)
Hemoglobin: 12.5 g/dL (ref 12.0–15.0)
MCH: 29.1 pg (ref 26.0–34.0)
MCHC: 33 g/dL (ref 30.0–36.0)
MCV: 88.3 fL (ref 78.0–100.0)
Platelets: 205 10*3/uL (ref 150–400)
RBC: 4.29 MIL/uL (ref 3.87–5.11)
RDW: 12.7 % (ref 11.5–15.5)
WBC: 4.2 10*3/uL (ref 4.0–10.5)

## 2015-08-21 LAB — URINALYSIS, ROUTINE W REFLEX MICROSCOPIC
BILIRUBIN URINE: NEGATIVE
Glucose, UA: NEGATIVE mg/dL
Hgb urine dipstick: NEGATIVE
Ketones, ur: 15 mg/dL — AB
Leukocytes, UA: NEGATIVE
NITRITE: NEGATIVE
Protein, ur: NEGATIVE mg/dL
Specific Gravity, Urine: 1.027 (ref 1.005–1.030)
UROBILINOGEN UA: 1 mg/dL (ref 0.0–1.0)
pH: 7.5 (ref 5.0–8.0)

## 2015-08-21 LAB — COMPREHENSIVE METABOLIC PANEL
ALK PHOS: 75 U/L (ref 38–126)
ALT: 20 U/L (ref 14–54)
ANION GAP: 13 (ref 5–15)
AST: 20 U/L (ref 15–41)
Albumin: 4.3 g/dL (ref 3.5–5.0)
BILIRUBIN TOTAL: 0.6 mg/dL (ref 0.3–1.2)
BUN: 15 mg/dL (ref 6–20)
CALCIUM: 9.8 mg/dL (ref 8.9–10.3)
CO2: 25 mmol/L (ref 22–32)
Chloride: 102 mmol/L (ref 101–111)
Creatinine, Ser: 0.81 mg/dL (ref 0.44–1.00)
GFR calc Af Amer: >60 mL/min (ref >60)
GFR calc non Af Amer: >60 mL/min (ref >60)
GLUCOSE: 98 mg/dL (ref 65–99)
Potassium: 3.3 mmol/L — ABNORMAL LOW (ref 3.5–5.1)
Sodium: 140 mmol/L (ref 135–145)
Total Protein: 7 g/dL (ref 6.5–8.1)

## 2015-08-21 LAB — ETHANOL

## 2015-08-21 LAB — ACETAMINOPHEN LEVEL: Acetaminophen (Tylenol), Serum: <10 ug/mL — ABNORMAL LOW (ref 10–30)

## 2015-08-21 LAB — CBG MONITORING, ED: GLUCOSE-CAPILLARY: 102 mg/dL — AB (ref 65–99)

## 2015-08-21 LAB — SALICYLATE LEVEL: Salicylate Lvl: <4 mg/dL (ref 2.8–30.0)

## 2015-08-21 MED ORDER — IBUPROFEN 800 MG PO TABS
800.0000 mg | ORAL_TABLET | Freq: Once | ORAL | Status: AC
  Administered 2015-08-21: 800 mg via ORAL
  Filled 2015-08-21: qty 1

## 2015-08-21 MED ORDER — IBUPROFEN 200 MG PO TABS: 600.0000 mg | ORAL_TABLET | Freq: Three times a day (TID) | ORAL | Status: DC | PRN

## 2015-08-21 NOTE — ED Notes (Signed)
Pt very upset that she has to stay overnight. She was informed of [facility] and made aware the visitation hours. Pt expressed dislike of the fact she can not have any visitors outside of the visiting hours. Pt also refused to sign her valuables envelope so this RN received a staff co-signer. Belongings (Cell phone) placed in safe.

## 2015-08-21 NOTE — ED Notes (Signed)
All belongings sent with family/friend: blue shirt and vest. Brown pants, cell phone, razor blade, and keys.   FriendEtheleen Hardin: Qua Schofield [phone] , informed of SI rules and visiting hours. Will return at 5:30
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
## 2015-08-21 NOTE — BH Assessment (Addendum)
Tele Assessment Note   Samantha Hardin is an 24 y.o. female who presents to [facility] voluntarily reporting chest pain and headache. Patient states that her chest and head may have been hurting due to excessive crying due to "relationship problems." Patient states that she feels that she was depressed when she came in but is no longer depressed. Patient states that she isolates herself "from the wrong ones" and she has crying spells but reports that is due to her recent "relationship situation." patient denies other symptoms of depression. Patient states that she thinks about "not being here" but does not endorse a plan or intent. Patient states that due to the recent stress of her relationship she thinks about "not being here" but states "everybody thinks about that, I would never do anything." Patient denies history of SI attempts. Patient denies previous or current self injurious behavior. Patient denies HI and history of being violent towards others. Patient denies recent charges and upcoming court dates. Patient denies AVH. Patient denies inpatient and outpatient psychiatric treatment but states that she would be interested in following up with treatment. Patient contracts for safety and denies current SI/HI and AVH. Patient states that she told the physician earlier that she was having difficulty and did not know how much longer she could put up with it but was referring to her relationship.   Patient is alert and oriented x4. Patient is calm and cooperative and is sitting upright in her bed dressed in scrubs. Patient speaks logically and coherently and is pleasant. Patient states that her concentration and memory are normal. Patient states that she sleeps 5-6 hours per night. Patient states that her appetite is fair. Patient denies any pending charges and upcoming court dates. Patient states that her brother is supportive. Patient denies history of trauma and abuse.   Diagnosis: Depression  Past Medical  History:  Past Medical History  Diagnosis Date  . Asthma   . Headache(784.0)     when on birth control  . Heart murmur   . Anemia   . Depression     never on meds, doing ok    Past Surgical History  Procedure Laterality Date  . Induced abortion      Family History:  Family History  Problem Relation Age of Onset  . Anesthesia problems Neg Hx     Social History:  reports that she has been smoking.  She has never used smokeless tobacco. She reports that she drinks alcohol. She reports that she does not use illicit drugs.  Additional Social History:  Alcohol / Drug Use Pain Medications: See PTA Prescriptions: See PTA Over the Counter: See PTA History of alcohol / drug use?: No history of alcohol / drug abuse  CIWA: CIWA-Ar BP: 118/72 mmHg Pulse Rate: 84 COWS:    PATIENT STRENGTHS: (choose at least two) "I'm smart, I'm a go getter, I don't ever give up."  Allergies:  Allergies  Allergen Reactions  . Latex Swelling and Rash    Home Medications:  (Not in a hospital admission)  OB/GYN Status:  Patient's last menstrual period was 08/20/2015.  General Assessment Data [facility] T[facility] Assessment: In system Is this a Tele or Face-to-Face Assessment?: Tele Assessment Is this an Initial Assessment or a Re-assessment for this encounter?: Initial Assessment Marital status: Single Is patient pregnant?: No Pregnancy Status: No Living Arrangements: Spouse/significant other (boyfriend) Can pt return to current living arrangement?: Yes Admission Status: Voluntary Is patient capable of signing voluntary admission?:  No Referral Source: Self/Family/Friend     Crisis Care Plan Living Arrangements: Spouse/significant other (boyfriend) Name of Psychiatrist: None Name of Therapist: None  Education Status Is patient currently in school?: No Highest grade of school patient has completed: 12  Risk to self with the past 6 months Suicidal Ideation: No Has  patient been a risk to self within the past 6 months prior to admission? : No Suicidal Intent: No Has patient had any suicidal intent within the past 6 months prior to admission? : No Is patient at risk for suicide?: No Suicidal Plan?: No Has patient had any suicidal plan within the past 6 months prior to admission? : No Access to Means: No What has been your use of drugs/alcohol within the last 12 months?: Denies Previous Attempts/Gestures: No How many times?: 0 Other Self Harm Risks: Denies Triggers for Past Attempts: None known Intentional Self Injurious Behavior: None Family Suicide History: No Recent stressful life event(s): Other (Comment) (relationship) Persecutory voices/beliefs?: No Depression: No Depression Symptoms: Tearfulness, Isolating Substance abuse history and/or treatment for substance abuse?: No Suicide prevention information given to non-admitted patients: Not applicable  Risk to Others within the past 6 months Homicidal Ideation: No Does patient have any lifetime risk of violence toward others beyond the six months prior to admission? : No Thoughts of Harm to Others: No Current Homicidal Intent: No Current Homicidal Plan: No Access to Homicidal Means: No Identified Victim: Denies History of harm to others?: No Assessment of Violence: None Noted Violent Behavior Description: Denies Does patient have access to weapons?: No Criminal Charges Pending?: No Does patient have a court date: No Is patient on probation?: No  Psychosis Hallucinations: None noted Delusions: None noted  Mental Status Report Appearance/Hygiene: In scrubs Eye Contact: Good Motor Activity: Unable to assess Speech: Logical/coherent Level of Consciousness: Alert Mood: Pleasant Affect: Appropriate to circumstance Anxiety Level: None Thought Processes: Coherent, Relevant Judgement: Unimpaired Orientation: Person, Place, Time, Situation, Appropriate for developmental age Obsessive  Compulsive Thoughts/Behaviors: None  Cognitive Functioning Concentration: Normal Memory: Recent Intact, Remote Intact IQ: Average Insight: Fair Impulse Control: Fair Appetite: Fair Sleep: No Change Total Hours of Sleep: 6 Vegetative Symptoms: None  ADLScreening [facility] Assessment Services) Patient's cognitive ability adequate to safely complete daily activities?: Yes Patient able to express need for assistance with ADLs?: Yes Independently performs ADLs?: Yes (appropriate for developmental age)  Prior Inpatient Therapy Prior Inpatient Therapy: No Prior Therapy Dates: N/A Prior Therapy Facilty/Provider(s): N/A Reason for Treatment: N/A  Prior Outpatient Therapy Prior Outpatient Therapy: No Prior Therapy Dates: N/A Prior Therapy Facilty/Provider(s): N/A Reason for Treatment: N/A Does patient have an ACCT team?: No Does patient have Intensive In-House Services?  : No Does patient have [facility] services? : No Does patient have P4CC services?: No  ADL Screening (condition at time of admission) Patient's cognitive ability adequate to safely complete daily activities?: Yes Is the patient deaf or have difficulty hearing?: No Does the patient have difficulty seeing, even when wearing glasses/contacts?: No Does the patient have difficulty concentrating, remembering, or making decisions?: No Patient able to express need for assistance with ADLs?: Yes Does the patient have difficulty dressing or bathing?: No Independently performs ADLs?: Yes (appropriate for developmental age) Does the patient have difficulty walking or climbing stairs?: No Weakness of Legs: None Weakness of Arms/Hands: None  Home Assistive Devices/Equipment Home Assistive Devices/Equipment: None  Therapy Consults (therapy consults require a physician order) PT Evaluation Needed: No OT Evalulation Needed: No SLP Evaluation Needed: No Abuse/Neglect Assessment (  Assessment to be complete while patient is  alone) Physical Abuse: Denies Verbal Abuse: Denies Sexual Abuse: Denies Exploitation of patient/patient's resources: Denies Self-Neglect: Denies Values / Beliefs Cultural Requests During Hospitalization: None Spiritual Requests During Hospitalization: None Consults Spiritual Care Consult Needed: No Social Work Consult Needed: No Merchant navy officer (For Healthcare) Does patient have an advance directive?: No Would patient like information on creating an advanced directive?: No - patient declined information    Additional Information 1:1 In Past 12 Months?: No CIRT Risk: No Elopement Risk: No Does patient have medical clearance?: Yes     Disposition:  Disposition Initial Assessment Completed for this Encounter: Yes Disposition of Patient: Other dispositions Other disposition(s): Other (Comment) (observe overngiht and evaluate in the morning per Alfonzo Feller)  Marquice Uddin 08/21/2015 6:00 PM

## 2015-08-21 NOTE — BH Assessment (Signed)
Consulted with Claudette Headonrad Withrow, DNP who recommends patient be observed overnight and re-evaluate in the AM by psychiatry. Contacted Eyvonne MechanicJeffrey Hedges, PA_C to inform recommendations.   Davina PokeJoVea Mallie Giambra, LCSW Therapeutic Triage Specialist [facility] 08/21/2015 5:48 PM

## 2015-08-21 NOTE — ED Notes (Addendum)
Onset 2 weeks ago headache, dizziness, lightheaded, and feeling of passing out.  Currently pain headache throbbing 9/10. Alert answering and following commands appropriate. Asked if patient was SI/HI stated SI denies HI. Currently very stressed due to boyfriend told patient something and feels hopelessness and SI with no plan.

## 2015-08-21 NOTE — ED Notes (Signed)
TSS at bedside 

## 2015-08-21 NOTE — ED Notes (Signed)
Pt states "I dont want to stay here, yall cant keep me if i want to leave". Pt angry about having to stay the night. Trey PaulaJeff PA at bedside to speak with patient.

## 2015-08-22 MED ORDER — POTASSIUM CHLORIDE CRYS ER 20 MEQ PO TBCR
40.0000 meq | EXTENDED_RELEASE_TABLET | Freq: Once | ORAL | Status: AC
  Administered 2015-08-22: 40 meq via ORAL
  Filled 2015-08-22: qty 2

## 2015-08-22 NOTE — ED Provider Notes (Signed)
Patient is alert pleasant cooperative Glasgow Coma Score 15 feels ready for discharge. She vehemently denies Y to hurt herself or others. She ambulates without difficulty and is in no distress at discharge. Patient follow-up with [facility]. Results for orders placed or performed during the hospital encounter of 08/21/15  CBC  Result Value Ref Range   WBC 4.2 4.0 - 10.5 K/uL   RBC 4.29 3.87 - 5.11 MIL/uL   Hemoglobin 12.5 12.0 - 15.0 g/dL   HCT 16.137.9 09.636.0 - 04.546.0 %   MCV 88.3 78.0 - 100.0 fL   MCH 29.1 26.0 - 34.0 pg   MCHC 33.0 30.0 - 36.0 g/dL   RDW 40.912.7 81.111.5 - 91.415.5 %   Platelets 205 150 - 400 K/uL  Urinalysis, Routine w reflex microscopic (not at [facility])  Result Value Ref Range   Color, Urine YELLOW YELLOW   APPearance CLEAR CLEAR   Specific Gravity, Urine 1.027 1.005 - 1.030   pH 7.5 5.0 - 8.0   Glucose, UA NEGATIVE NEGATIVE mg/dL   Hgb urine dipstick NEGATIVE NEGATIVE   Bilirubin Urine NEGATIVE NEGATIVE   Ketones, ur 15 (A) NEGATIVE mg/dL   Protein, ur NEGATIVE NEGATIVE mg/dL   Urobilinogen, UA 1.0 0.0 - 1.0 mg/dL   Nitrite NEGATIVE NEGATIVE   Leukocytes, UA NEGATIVE NEGATIVE  Comprehensive metabolic panel  Result Value Ref Range   Sodium 140 135 - 145 mmol/L   Potassium 3.3 (L) 3.5 - 5.1 mmol/L   Chloride 102 101 - 111 mmol/L   CO2 25 22 - 32 mmol/L   Glucose, Bld 98 65 - 99 mg/dL   BUN 15 6 - 20 mg/dL   Creatinine, Ser 7.820.81 0.44 - 1.00 mg/dL   Calcium 9.8 8.9 - 95.610.3 mg/dL   Total Protein 7.0 6.5 - 8.1 g/dL   Albumin 4.3 3.5 - 5.0 g/dL   AST 20 15 - 41 U/L   ALT 20 14 - 54 U/L   Alkaline Phosphatase 75 38 - 126 U/L   Total Bilirubin 0.6 0.3 - 1.2 mg/dL   GFR calc non Af Amer >60 >60 mL/min   GFR calc Af Amer >60 >60 mL/min   Anion gap 13 5 - 15  Ethanol (ETOH)  Result Value Ref Range   Alcohol, Ethyl (B) <5 <5 mg/dL  Salicylate level  Result Value Ref Range   Salicylate Lvl <4.0 2.8 - 30.0 mg/dL  Acetaminophen level  Result Value Ref Range   Acetaminophen  (Tylenol), Serum <10 (L) 10 - 30 ug/mL  Urine rapid drug screen (hosp performed) (Not at [facility])  Result Value Ref Range   Opiates NONE DETECTED NONE DETECTED   Cocaine NONE DETECTED NONE DETECTED   Benzodiazepines NONE DETECTED NONE DETECTED   Amphetamines NONE DETECTED NONE DETECTED   Tetrahydrocannabinol POSITIVE (A) NONE DETECTED   Barbiturates NONE DETECTED NONE DETECTED  CBG monitoring, ED  Result Value Ref Range   Glucose-Capillary 102 (H) 65 - 99 mg/dL  I-Stat beta hCG blood, ED ([facility], [facility], [facility] only)  Result Value Ref Range   I-stat hCG, quantitative <5.0 <5 mIU/mL   Comment 3           No results found.   Doug SouSam Sanye Ledesma, MD 08/22/15 1017

## 2015-08-22 NOTE — ED Notes (Addendum)
Dr. Shela CommonsJ made aware pt has been cleared for discharge. He is coming to see patient. . Is a x 4 and ready for discharge. [facility].

## 2015-08-22 NOTE — Progress Notes (Signed)
Discussed pt case with Dr. Lucianne MussKumar, [facility], and after review of chart, Dr. Lucianne MussKumar recommends pt be d/c with outpatient resources for depression tx.   [facility] as [facility] for [facility] health providers. Information available in pt's d/c summary.  No barriers to d/c at this time.  Ilean SkillMeghan Raima Geathers, MSW, LCSW Clinical Social Work, Disposition  08/22/2015 [phone]

## 2016-01-28 ENCOUNTER — Encounter (HOSPITAL_COMMUNITY): Payer: Self-pay | Admitting: *Deleted

## 2016-01-28 ENCOUNTER — Inpatient Hospital Stay (HOSPITAL_COMMUNITY)
Admission: AD | Admit: 2016-01-28 | Discharge: 2016-01-28 | Disposition: A | Discharge status: Home / Self Care | Payer: BLUE CROSS/BLUE SHIELD | Source: Ambulatory Visit | Attending: Obstetrics & Gynecology | Admitting: Obstetrics & Gynecology

## 2016-01-28 DIAGNOSIS — O26811 Pregnancy related exhaustion and fatigue, first trimester: Secondary | ICD-10-CM

## 2016-01-28 DIAGNOSIS — Z3A01 Less than 8 weeks gestation of pregnancy: Secondary | ICD-10-CM | POA: Diagnosis not present

## 2016-01-28 DIAGNOSIS — Z3201 Encounter for pregnancy test, result positive: Secondary | ICD-10-CM | POA: Insufficient documentation

## 2016-01-28 LAB — CBC
HEMATOCRIT: 36.4 % (ref 36.0–46.0)
HEMOGLOBIN: 12.6 g/dL (ref 12.0–15.0)
MCH: 30 pg (ref 26.0–34.0)
MCHC: 34.6 g/dL (ref 30.0–36.0)
MCV: 86.7 fL (ref 78.0–100.0)
Platelets: 191 10*3/uL (ref 150–400)
RBC: 4.2 MIL/uL (ref 3.87–5.11)
RDW: 13.1 % (ref 11.5–15.5)
WBC: 5.6 10*3/uL (ref 4.0–10.5)

## 2016-01-28 LAB — POCT PREGNANCY, URINE: PREG TEST UR: POSITIVE — AB

## 2016-01-28 MED ORDER — CONCEPT OB 130-92.4-1 MG PO CAPS: 1.0000 | ORAL_CAPSULE | Freq: Every day | ORAL | Status: DC

## 2016-01-28 NOTE — Discharge Instructions (Signed)
Prenatal Care °WHAT IS PRENATAL CARE?  °Prenatal care is the process of caring for a pregnant woman before she gives birth. Prenatal care makes sure that she and her baby remain as healthy as possible throughout pregnancy. Prenatal care may be provided by a midwife, family practice health care provider, or a childbirth and pregnancy specialist (obstetrician). Prenatal care may include physical examinations, testing, treatments, and education on nutrition, lifestyle, and social support services. °WHY IS PRENATAL CARE SO IMPORTANT?  °Early and consistent prenatal care increases the chance that you and your baby will remain healthy throughout your pregnancy. This type of care also decreases a baby's risk of being born too early (prematurely), or being born smaller than expected (small for gestational age). Any underlying medical conditions you may have that could pose a risk during your pregnancy are discussed during prenatal care visits. You will also be monitored regularly for any new conditions that may arise during your pregnancy so they can be treated quickly and effectively. °WHAT HAPPENS DURING PRENATAL CARE VISITS? °Prenatal care visits may include the following: °Discussion °Tell your health care provider about any new signs or symptoms you have experienced since your last visit. These might include: °· Nausea or vomiting. °· Increased or decreased level of energy. °· Difficulty sleeping. °· Back or leg pain. °· Weight changes. °· Frequent urination. °· Shortness of breath with physical activity. °· Changes in your skin, such as the development of a rash or itchiness. °· Vaginal discharge or bleeding. °· Feelings of excitement or nervousness. °· Changes in your baby's movements. °You may want to write down any questions or topics you want to discuss with your health care provider and bring them with you to your appointment. °Examination °During your first prenatal care visit, you will likely have a complete  physical exam. Your health care provider will often examine your vagina, cervix, and the position of your uterus, as well as check your heart, lungs, and other body systems. As your pregnancy progresses, your health care provider will measure the size of your uterus and your baby's position inside your uterus. He or she may also examine you for early signs of labor. Your prenatal visits may also include checking your blood pressure and, after about 10-12 weeks of pregnancy, listening to your baby's heartbeat. °Testing °Regular testing often includes: °· Urinalysis. This checks your urine for glucose, protein, or signs of infection. °· Blood count. This checks the levels of white and red blood cells in your body. °· Tests for sexually transmitted infections (STIs). Testing for STIs at the beginning of pregnancy is routinely done and is required in many states. °· Antibody testing. You will be checked to see if you are immune to certain illnesses, such as rubella, that can affect a developing fetus. °· Glucose screen. Around 24-28 weeks of pregnancy, your blood glucose level will be checked for signs of gestational diabetes. Follow-up tests may be recommended. °· Group B strep. This is a bacteria that is commonly found inside a woman's vagina. This test will inform your health care provider if you need an antibiotic to reduce the amount of this bacteria in your body prior to labor and childbirth. °· Ultrasound. Many pregnant women undergo an ultrasound screening around 18-20 weeks of pregnancy to evaluate the health of the fetus and check for any developmental abnormalities. °· HIV (human immunodeficiency virus) testing. Early in your pregnancy, you will be screened for HIV. If you are at high risk for HIV, this test   may be repeated during your third trimester of pregnancy. °You may be offered other testing based on your age, personal or family medical history, or other factors.  °HOW OFTEN SHOULD I PLAN TO SEE MY  HEALTH CARE PROVIDER FOR PRENATAL CARE? °Your prenatal care check-up schedule depends on any medical conditions you have before, or develop during, your pregnancy. If you do not have any underlying medical conditions, you will likely be seen for checkups: °· Monthly, during the first 6 months of pregnancy. °· Twice a month during months 7 and 8 of pregnancy. °· Weekly starting in the 9th month of pregnancy and until delivery. °If you develop signs of early labor or other concerning signs or symptoms, you may need to see your health care provider more often. Ask your health care provider what prenatal care schedule is best for you. °WHAT CAN I DO TO KEEP MYSELF AND MY BABY AS HEALTHY AS POSSIBLE DURING MY PREGNANCY? °· Take a prenatal vitamin containing 400 micrograms (0.4 mg) of folic acid every day. Your health care provider may also ask you to take additional vitamins such as iodine, vitamin D, iron, copper, and zinc. °· Take 1500-2000 mg of calcium daily starting at your 20th week of pregnancy until you deliver your baby. °· Make sure you are up to date on your vaccinations. Unless directed otherwise by your health care provider: °¨ You should receive a tetanus, diphtheria, and pertussis (Tdap) vaccination between the 27th and 36th week of your pregnancy, regardless of when your last Tdap immunization occurred. This helps protect your baby from whooping cough (pertussis) after he or she is born. °¨ You should receive an annual inactivated influenza vaccine (IIV) to help protect you and your baby from influenza. This can be done at any point during your pregnancy. °· Eat a well-rounded diet that includes: °¨ Fresh fruits and vegetables. °¨ Lean proteins. °¨ Calcium-rich foods such as milk, yogurt, hard cheeses, and dark, leafy greens. °¨ Whole grain breads. °· Do not eat seafood high in mercury, including: °¨ Swordfish. °¨ Tilefish. °¨ Shark. °¨ King mackerel. °¨ More than 6 oz tuna per week. °· Do not eat: °¨ Raw  or undercooked meats or eggs. °¨ Unpasteurized foods, such as soft cheeses (brie, blue, or feta), juices, and milks. °¨ Lunch meats. °¨ Hot dogs that have not been heated until they are steaming. °· Drink enough water to keep your urine clear or pale yellow. For many women, this may be 10 or more 8 oz glasses of water each day. Keeping yourself hydrated helps deliver nutrients to your baby and may prevent the start of pre-term uterine contractions. °· Do not use any tobacco products including cigarettes, chewing tobacco, or electronic cigarettes. If you need help quitting, ask your health care provider. °· Do not drink beverages containing alcohol. No safe level of alcohol consumption during pregnancy has been determined. °· Do not use any illegal drugs. These can harm your developing baby or cause a miscarriage. °· Ask your health care provider or pharmacist before taking any prescription or over-the-counter medicines, herbs, or supplements. °· Limit your caffeine intake to no more than 200 mg per day. °· Exercise. Unless told otherwise by your health care provider, try to get 30 minutes of moderate exercise most days of the week. Do not  do high-impact activities, contact sports, or activities with a high risk of falling, such as horseback riding or downhill skiing. °· Get plenty of rest. °· Avoid anything that raises your   body temperature, such as hot tubs and saunas. °· If you own a cat, do not empty its litter box. Bacteria contained in cat feces can cause an infection called toxoplasmosis. This can result in serious harm to the fetus. °· Stay away from chemicals such as insecticides, lead, mercury, and cleaning or paint products that contain solvents. °· Do not have any X-rays taken unless medically necessary. °· Take a childbirth and breastfeeding preparation class. Ask your health care provider if you need a referral or recommendation. °  °This information is not intended to replace advice given to you by  your health care provider. Make sure you discuss any questions you have with your health care provider. °  °Document Released: 10/15/2003 Document Revised: 11/02/2014 Document Reviewed: 12/27/2013 °Elsevier Interactive Patient Education ©2016 Elsevier Inc. ° °

## 2016-01-28 NOTE — MAU Provider Note (Signed)
Samantha Hardin is a 25 y.o. G3P0020 at [id number] who presents to MAU today for pregnancy verification. The patient denies abdominal pain or vaginal bleeding today. Reports fatigue, feeling drained, breast tenderness.  BP 107/67 mmHg  Pulse 77  Temp(Src) 98.4 F (36.9 C) (Oral)  Resp 16  LMP 12/24/2015 (Approximate)  CONSTITUTIONAL: Well-developed, well-nourished female in no acute distress. No pallor.  ENT: External right and left ear normal.  EYES: EOM intact, conjunctivae normal.  MUSCULOSKELETAL: Normal range of motion.  CARDIOVASCULAR: Regular heart rate RESPIRATORY: Normal effort NEUROLOGICAL: Alert and oriented to person, place, and time.  SKIN: Skin is warm and dry. No rash noted. Not diaphoretic. No erythema. No pallor. PSYCH: Normal mood and affect. Normal behavior. Normal judgment and thought content.  Results for orders placed or performed during the hospital encounter of 01/28/16 (from the past 24 hour(s))  CBC     Status: None   Collection Time: 01/28/16  4:44 PM  Result Value Ref Range   WBC 5.6 4.0 - 10.5 K/uL   RBC 4.20 3.87 - 5.11 MIL/uL   Hemoglobin 12.6 12.0 - 15.0 g/dL   HCT 40.336.4 47.436.0 - 25.946.0 %   MCV 86.7 78.0 - 100.0 fL   MCH 30.0 26.0 - 34.0 pg   MCHC 34.6 30.0 - 36.0 g/dL   RDW 56.313.1 87.511.5 - 64.315.5 %   Platelets 191 150 - 400 K/uL  Pregnancy, urine POC     Status: Abnormal   Collection Time: 01/28/16  4:54 PM  Result Value Ref Range   Preg Test, Ur POSITIVE (A) NEGATIVE    A: pos pregnancy test Fatigue. No anemia.   P: Discharge home Pregnancy confirmation letter and list of area OB providers given Patient advised to start taking prenatal vitamins First trimester warning signs reviewed Patient may return to MAU as needed or if her condition were to change or worsen   [place] Karo Rog, CNM  01/28/2016 5:18 PM

## 2016-01-28 NOTE — MAU Note (Signed)
Pt had positive HPT today.  Pt feels drained, denies pain or bleeding.  Breasts are tender.

## 2016-02-04 ENCOUNTER — Inpatient Hospital Stay (HOSPITAL_COMMUNITY)
Admission: AD | Admit: 2016-02-04 | Discharge: 2016-02-04 | Disposition: A | Discharge status: Home / Self Care | Payer: BLUE CROSS/BLUE SHIELD | Source: Ambulatory Visit | Attending: Family Medicine | Admitting: Family Medicine

## 2016-02-04 ENCOUNTER — Encounter (HOSPITAL_COMMUNITY): Payer: Self-pay | Admitting: *Deleted

## 2016-02-04 DIAGNOSIS — Z3687 Encounter for antenatal screening for uncertain dates: Secondary | ICD-10-CM

## 2016-02-04 DIAGNOSIS — R109 Unspecified abdominal pain: Secondary | ICD-10-CM | POA: Diagnosis present

## 2016-02-04 DIAGNOSIS — O26892 Other specified pregnancy related conditions, second trimester: Secondary | ICD-10-CM | POA: Diagnosis not present

## 2016-02-04 DIAGNOSIS — J45909 Unspecified asthma, uncomplicated: Secondary | ICD-10-CM | POA: Insufficient documentation

## 2016-02-04 DIAGNOSIS — F329 Major depressive disorder, single episode, unspecified: Secondary | ICD-10-CM | POA: Insufficient documentation

## 2016-02-04 DIAGNOSIS — Z3A08 8 weeks gestation of pregnancy: Secondary | ICD-10-CM

## 2016-02-04 DIAGNOSIS — M791 Myalgia: Secondary | ICD-10-CM | POA: Diagnosis not present

## 2016-02-04 DIAGNOSIS — Z87891 Personal history of nicotine dependence: Secondary | ICD-10-CM | POA: Insufficient documentation

## 2016-02-04 DIAGNOSIS — R112 Nausea with vomiting, unspecified: Secondary | ICD-10-CM | POA: Diagnosis not present

## 2016-02-04 DIAGNOSIS — O219 Vomiting of pregnancy, unspecified: Secondary | ICD-10-CM

## 2016-02-04 DIAGNOSIS — M7918 Myalgia, other site: Secondary | ICD-10-CM

## 2016-02-04 LAB — URINE MICROSCOPIC-ADD ON
BACTERIA UA: NONE SEEN
RBC / HPF: NONE SEEN RBC/hpf (ref 0–5)

## 2016-02-04 LAB — URINALYSIS, ROUTINE W REFLEX MICROSCOPIC
Bilirubin Urine: NEGATIVE
GLUCOSE, UA: NEGATIVE mg/dL
HGB URINE DIPSTICK: NEGATIVE
Ketones, ur: NEGATIVE mg/dL
Leukocytes, UA: NEGATIVE
Nitrite: NEGATIVE
PH: 6 (ref 5.0–8.0)
PROTEIN: 30 mg/dL — AB
Specific Gravity, Urine: 1.02 (ref 1.005–1.030)

## 2016-02-04 MED ORDER — METOCLOPRAMIDE HCL 10 MG PO TABS: 10.0000 mg | ORAL_TABLET | Freq: Four times a day (QID) | ORAL | Status: DC

## 2016-02-04 MED ORDER — METOCLOPRAMIDE HCL 10 MG PO TABS
10.0000 mg | ORAL_TABLET | Freq: Once | ORAL | Status: AC
  Administered 2016-02-04: 10 mg via ORAL
  Filled 2016-02-04: qty 1

## 2016-02-04 MED ORDER — PROMETHAZINE HCL 25 MG PO TABS: 12.5000 mg | ORAL_TABLET | Freq: Four times a day (QID) | ORAL | Status: DC | PRN

## 2016-02-04 NOTE — MAU Provider Note (Signed)
History     CSN: [id number]  Arrival date and time: 02/04/16 0830   First Provider Initiated Contact with Patient 02/04/16 [phone]      Chief Complaint  Patient presents with  . Abdominal Pain  . Emesis During Pregnancy   HPI   Patient is a 25 y/o 823P0020 female at 6weeks presenting today for right flank pain that started this morning after 6-7 episodes of non-bilious non-bloody vomiting. Right flank pain is localized to lower right rib cage and is worse with movement and palpation. Patient states she has had nausea and vomiting with food for the past week. Has not had bowel movement in 1 week, endorses flatus. Patient denies fevers, chills, night sweats, hematemesis, melena, hematochezia, abdominal pain, pelvic pain, dysuria, hematuria. Her main concern today is her frequent vomiting.   OB History    Gravida Para Term Preterm AB TAB SAB Ectopic Multiple Living   3 0 0 0 2 1 1 0 0 0       Past Medical History  Diagnosis Date  . Asthma   . Headache(784.0)     when on birth control  . Heart murmur   . Anemia   . Depression     never on meds, doing ok    Past Surgical History  Procedure Laterality Date  . Induced abortion      Family History  Problem Relation Age of Onset  . Anesthesia problems Neg Hx     Social History  Substance Use Topics  . Smoking status: Former Smoker -- 0.25 packs/day    Types: Cigarettes    Quit date: 10/26/2014  . Smokeless tobacco: Never Used  . Alcohol Use: Yes     Comment: monthly    Allergies:  Allergies  Allergen Reactions  . Latex Swelling and Rash    Prescriptions prior to admission  Medication Sig Dispense Refill Last Dose  . Prenat w/o A Vit-FeFum-FePo-FA (CONCEPT OB) 130-92.4-1 MG CAPS Take 1 tablet by mouth daily. 30 capsule 12     Review of Systems  Constitutional: Negative for fever, chills and weight loss.  HENT: Negative for congestion and sore throat.   Eyes: Negative for blurred vision and double vision.   Respiratory: Negative for cough and hemoptysis.   Cardiovascular: Negative for chest pain and palpitations.  Gastrointestinal: Positive for nausea, vomiting and constipation. Negative for heartburn, abdominal pain, diarrhea, blood in stool and melena.  Genitourinary: Negative for dysuria, urgency and frequency.  Musculoskeletal: Negative for myalgias, back pain and neck pain.  Neurological: Negative for dizziness, tingling, weakness and headaches.  Endo/Heme/Allergies: Does not bruise/bleed easily.   Physical Exam   Blood pressure 128/85, pulse 68, temperature 98.2 F (36.8 C), temperature source Oral, resp. rate 16, height 5\' 6"  (1.676 m), weight 65.772 kg (145 lb), last menstrual period 12/24/2015.  Physical Exam  Constitutional: She is oriented to person, place, and time. She appears well-developed and well-nourished.  HENT:  Head: Normocephalic and atraumatic.  Eyes: Pupils are equal, round, and reactive to light.  Neck: Normal range of motion.  Cardiovascular: Normal rate, regular rhythm, normal heart sounds and intact distal pulses.   Respiratory: Effort normal and breath sounds normal.  GI: Soft. Bowel sounds are normal. She exhibits no distension. There is no tenderness.  Musculoskeletal: Normal range of motion.  Neurological: She is alert and oriented to person, place, and time.  Skin: Skin is warm and dry. No pallor.  Psychiatric: She has a normal mood and affect. Her behavior is  normal. Judgment and thought content normal.    MAU Course  Procedures  MDM  U/A normal without ketones, leuks or nitrites, and spec grav 1.020. Patient shows no signs of dehydration  Assessment and Plan  Patient is a 25 y/o G31P0020 female at 6weeks presenting today for right flank pain at lower right rib cage that started this morning after 6-7 episodes of non-bilious non-bloody vomiting. Pain is not associated with meals, likely musculoskeletal in nature from vomiting. Patient's main concern  today is the frequent vomiting, given Metoclopramide 10 mg with food trial. Patient tolerating food well with no nausea or vomiting. Discharged with prescription of Metoclopramide. All questions were answered, patient has no other complaints.   Samantha Hardin Samantha Hardin 02/04/2016, 9:18 AM

## 2016-02-04 NOTE — Discharge Instructions (Signed)
Nausea medication to take during pregnancy:  ° °Unisom (doxylamine succinate 25 mg tablets) Take one tablet daily at bedtime. If symptoms are not adequately controlled, the dose can be increased to a maximum recommended dose of two tablets daily (1/2 tablet in the morning, 1/2 tablet mid-afternoon and one at bedtime). ° °Vitamin B6 100mg tablets. Take one tablet twice a day (up to 200 mg per day). ° °Add Phenergan as prescribed to take as needed.  ° ° °Morning Sickness °Morning sickness is when you feel sick to your stomach (nauseous) during pregnancy. This nauseous feeling may or may not come with vomiting. It often occurs in the morning but can be a problem any time of day. Morning sickness is most common during the first trimester, but it may continue throughout pregnancy. While morning sickness is unpleasant, it is usually harmless unless you develop severe and continual vomiting (hyperemesis gravidarum). This condition requires more intense treatment.  °CAUSES  °The cause of morning sickness is not completely known but seems to be related to normal hormonal changes that occur in pregnancy. °RISK FACTORS °You are at greater risk if you: °· Experienced nausea or vomiting before your pregnancy. °· Had morning sickness during a previous pregnancy. °· Are pregnant with more than one baby, such as twins. °TREATMENT  °Do not use any medicines (prescription, over-the-counter, or herbal) for morning sickness without first talking to your health care provider. Your health care provider may prescribe or recommend: °· Vitamin B6 supplements. °· Anti-nausea medicines. °· The herbal medicine ginger. °HOME CARE INSTRUCTIONS  °· Only take over-the-counter or prescription medicines as directed by your health care provider. °· Taking multivitamins before getting pregnant can prevent or decrease the severity of morning sickness in most women. °· Eat a piece of dry toast or unsalted crackers before getting out of bed in the  morning. °· Eat five or six small meals a day. °· Eat dry and bland foods (rice, baked potato). Foods high in carbohydrates are often helpful. °· Do not drink liquids with your meals. Drink liquids between meals. °· Avoid greasy, fatty, and spicy foods. °· Get someone to cook for you if the smell of any food causes nausea and vomiting. °· If you feel nauseous after taking prenatal vitamins, take the vitamins at night or with a snack.  °· Snack on protein foods (nuts, yogurt, cheese) between meals if you are hungry. °· Eat unsweetened gelatins for desserts. °· Wearing an acupressure wristband (worn for sea sickness) may be helpful. °· Acupuncture may be helpful. °· Do not smoke. °· Get a humidifier to keep the air in your house free of odors. °· Get plenty of fresh air. °SEEK MEDICAL CARE IF:  °· Your home remedies are not working, and you need medicine. °· You feel dizzy or lightheaded. °· You are losing weight. °SEEK IMMEDIATE MEDICAL CARE IF:  °· You have persistent and uncontrolled nausea and vomiting. °· You pass out (faint). °MAKE SURE YOU: °· Understand these instructions. °· Will watch your condition. °· Will get help right away if you are not doing well or get worse. °  °This information is not intended to replace advice given to you by your health care provider. Make sure you discuss any questions you have with your health care provider. °  °Document Released: 12/03/2006 Document Revised: 10/17/2013 Document Reviewed: 03/29/2013 °Elsevier Interactive Patient Education ©2016 [employer] Inc. ° °

## 2016-02-04 NOTE — MAU Provider Note (Signed)
Chief Complaint: Abdominal Pain and Emesis During Pregnancy   First Provider Initiated Contact with Patient 02/04/16 0906      SUBJECTIVE HPI: Samantha Hardin is a 25 y.o. G3P0020 at 5976w0d by LMP who presents to maternity admissions reporting right flank pain starting this morning after vomiting x 6-7 after eating.  She reports nausea x 1 week with some vomiting after eating.  She has tried eating bland food like crackers but it does not help. She has not tried any medications.  She reports no bowel movement in 1 week. She denies lower abdominal or pelvic pain, vaginal bleeding, urinary symptoms, h/a, or fever/chills.   HPI  Past Medical History  Diagnosis Date  . Asthma   . Headache(784.0)     when on birth control  . Heart murmur   . Anemia   . Depression     never on meds, doing ok   Past Surgical History  Procedure Laterality Date  . Induced abortion     Social History   Social History  . Marital Status: Single    Spouse Name: N/A  . Number of Children: N/A  . Years of Education: N/A   Occupational History  . Not on file.   Social History Main Topics  . Smoking status: Former Smoker -- 0.25 packs/day    Types: Cigarettes    Quit date: 10/26/2014  . Smokeless tobacco: Never Used  . Alcohol Use: Yes     Comment: monthly  . Drug Use: No  . Sexual Activity: Yes    Birth Control/ Protection: None   Other Topics Concern  . Not on file   Social History Narrative   No current facility-administered medications on file prior to encounter.   Current Outpatient Prescriptions on File Prior to Encounter  Medication Sig Dispense Refill  . Prenat w/o A Vit-FeFum-FePo-FA (CONCEPT OB) 130-92.4-1 MG CAPS Take 1 tablet by mouth daily. 30 capsule 12   Allergies  Allergen Reactions  . Latex Swelling and Rash    ROS:  Review of Systems  Constitutional: Negative for fever, chills and fatigue.  Respiratory: Negative for shortness of breath.   Cardiovascular: Negative for  chest pain.  Gastrointestinal: Positive for nausea and vomiting.  Genitourinary: Positive for flank pain. Negative for dysuria, vaginal bleeding, vaginal discharge, difficulty urinating, vaginal pain and pelvic pain.  Neurological: Negative for dizziness and headaches.  Psychiatric/Behavioral: Negative.      I have reviewed patient's Past Medical Hx, Surgical Hx, Family Hx, Social Hx, medications and allergies.   Physical Exam   Patient Vitals for the past 24 hrs:  BP Temp Temp src Pulse Resp Height Weight  02/04/16 1051 112/66 mmHg - - 73 16 - -  02/04/16 0840 128/85 mmHg 98.2 F (36.8 C) Oral 68 16 - -  02/04/16 0838 - - - - - 5\' 6"  (1.676 m) 145 lb (65.772 kg)   Constitutional: Well-developed, well-nourished female in no acute distress.  Cardiovascular: normal rate Respiratory: normal effort GI: Abd soft, non-tender. Pos BS x 4 MS: Extremities nontender, no edema, normal ROM, tenderness in right flank, only along lower margin of ribs Neurologic: Alert and oriented x 4.  GU: Neg CVAT.  PELVIC EXAM: Deferred   LAB RESULTS Results for orders placed or performed during the hospital encounter of 02/04/16 (from the past 24 hour(s))  Urinalysis, Routine w reflex microscopic (not [facility])     Status: Abnormal   Collection Time: 02/04/16  8:34 AM  Result Value Ref  Range   Color, Urine YELLOW YELLOW   APPearance CLEAR CLEAR   Specific Gravity, Urine 1.020 1.005 - 1.030   pH 6.0 5.0 - 8.0   Glucose, UA NEGATIVE NEGATIVE mg/dL   Hgb urine dipstick NEGATIVE NEGATIVE   Bilirubin Urine NEGATIVE NEGATIVE   Ketones, ur NEGATIVE NEGATIVE mg/dL   Protein, ur 30 (A) NEGATIVE mg/dL   Nitrite NEGATIVE NEGATIVE   Leukocytes, UA NEGATIVE NEGATIVE  Urine microscopic-add on     Status: Abnormal   Collection Time: 02/04/16  8:34 AM  Result Value Ref Range   Squamous Epithelial / LPF 0-5 (A) NONE SEEN   WBC, UA 0-5 0 - 5 WBC/hpf   RBC / HPF NONE SEEN 0 - 5 RBC/hpf   Bacteria, UA NONE SEEN  NONE SEEN       IMAGING No results found.  MAU Management/MDM: Ordered labs and reviewed results.  No dehydration noted on UA, pain palpable along ribs, likely musculoskeletal.  No pelvic pain or bleeding.  Will treat nausea with PO Reglan 10 mg.  Pt tolerated PO food and fluids in MAU after medication.  Rx for Reglan 10 mg Q 6 hours PRN and Phenergan 12.5-25 mg PO to use at night.  Pt stable at time of discharge.  ASSESSMENT 1. Nausea and vomiting during pregnancy prior to [redacted] weeks gestation   2. Musculoskeletal pain   3. [redacted] weeks gestation of pregnancy   4. Unsure of LMP (last menstrual period) as reason for ultrasound scan     PLAN Discharge home Outpatient [place] ordered Reviewed list of OB providers, pt unsure of where she plans to go Pt to apply for pregnancy Medicaid this week Return to MAU as needed for emergencies     Medication List    TAKE these medications        albuterol 108 (90 Base) MCG/ACT inhaler  Commonly known as:  PROVENTIL HFA;VENTOLIN HFA  Inhale 2 puffs into the lungs every 6 (six) hours as needed for wheezing or shortness of breath.     CONCEPT OB 130-92.4-1 MG Caps  Take 1 tablet by mouth daily.     metoCLOPramide 10 MG tablet  Commonly known as:  REGLAN  Take 1 tablet (10 mg total) by mouth every 6 (six) hours.     promethazine 25 MG tablet  Commonly known as:  PHENERGAN  Take 0.5-1 tablets (12.5-25 mg total) by mouth every 6 (six) hours as needed for nausea.         Sharen Counter Certified Nurse-Midwife 02/04/2016  11:04 AM

## 2016-02-04 NOTE — MAU Note (Addendum)
Vomiting frequently, states this morning she has felt a pain in her R rib cage area. States she has been having headaches, but not today.

## 2016-02-18 ENCOUNTER — Ambulatory Visit (HOSPITAL_COMMUNITY)
Admission: RE | Admit: 2016-02-18 | Discharge: 2016-02-18 | Disposition: A | Discharge status: Home / Self Care | Payer: BLUE CROSS/BLUE SHIELD | Source: Ambulatory Visit | Attending: Advanced Practice Midwife | Admitting: Advanced Practice Midwife

## 2016-02-18 ENCOUNTER — Ambulatory Visit (INDEPENDENT_AMBULATORY_CARE_PROVIDER_SITE_OTHER): Payer: BLUE CROSS/BLUE SHIELD | Admitting: Certified Nurse Midwife

## 2016-02-18 DIAGNOSIS — Z3481 Encounter for supervision of other normal pregnancy, first trimester: Secondary | ICD-10-CM

## 2016-02-18 DIAGNOSIS — Z3A08 8 weeks gestation of pregnancy: Secondary | ICD-10-CM | POA: Diagnosis not present

## 2016-02-18 DIAGNOSIS — Z36 Encounter for antenatal screening of mother: Secondary | ICD-10-CM | POA: Insufficient documentation

## 2016-02-18 DIAGNOSIS — Z3687 Encounter for antenatal screening for uncertain dates: Secondary | ICD-10-CM

## 2016-02-18 NOTE — Patient Instructions (Signed)

## 2016-02-18 NOTE — Progress Notes (Signed)
Ultrasounds Results Note  SUBJECTIVE HPI: Doing well; Here for follow up ultrasound results [name] is a 25 y.o. G3P0020 at 4178w0d by LMP who [facility] for followup ultrasound results. The patient denies abdominal pain or vaginal bleeding.   Repeat ultrasound was performed earlier today. Viable pregnancy noted. Pt is start prenatal care  Past Medical History  Diagnosis Date  . Asthma   . Headache(784.0)     when on birth control  . Heart murmur   . Anemia   . Depression     never on meds, doing ok   Past Surgical History  Procedure Laterality Date  . Induced abortion     Social History   Social History  . Marital Status: Single    Spouse Name: N/A  . Number of Children: N/A  . Years of Education: N/A   Occupational History  . Not on file.   Social History Main Topics  . Smoking status: Former Smoker -- 0.25 packs/day    Types: Cigarettes    Quit date: 10/26/2014  . Smokeless tobacco: Never Used  . Alcohol Use: Yes     Comment: monthly  . Drug Use: No  . Sexual Activity: Yes    Birth Control/ Protection: None   Other Topics Concern  . Not on file   Social History Narrative   Current Outpatient Prescriptions on File Prior to Visit  Medication Sig Dispense Refill  . albuterol (PROVENTIL HFA;VENTOLIN HFA) 108 (90 Base) MCG/ACT inhaler Inhale 2 puffs into the lungs every 6 (six) hours as needed for wheezing or shortness of breath.    . metoCLOPramide (REGLAN) 10 MG tablet Take 1 tablet (10 mg total) by mouth every 6 (six) hours. 30 tablet 2  . Prenat w/o A Vit-FeFum-FePo-FA (CONCEPT OB) 130-92.4-1 MG CAPS Take 1 tablet by mouth daily. 30 capsule 12  . promethazine (PHENERGAN) 25 MG tablet Take 0.5-1 tablets (12.5-25 mg total) by mouth every 6 (six) hours as needed for nausea. 30 tablet 2   No current facility-administered medications on file prior to visit.   Allergies  Allergen Reactions  . Latex Swelling and Rash    I  have reviewed patient's Past Medical Hx, Surgical Hx, Family Hx, Social Hx, medications and allergies.   Review of Systems Review of Systems  Constitutional: Negative for fever and chills.  Gastrointestinal: Negative for nausea, vomiting, abdominal pain, diarrhea and constipation.  Genitourinary: Negative for dysuria.  Musculoskeletal: Negative for back pain.  Neurological: Negative for dizziness and weakness.    Physical Exam  LMP 12/24/2015 (Approximate)  GENERAL: Well-developed, well-nourished female in no acute distress.  HEENT: Normocephalic, atraumatic.   LUNGS: Effort normal ABDOMEN: soft, non-tender HEART: Regular rate  SKIN: Warm, dry and without erythema PSYCH: Normal mood and affect NEURO: Alert and oriented x 4  LAB RESULTS No results found for this or any previous visit (from the past 24 hour(s)).  IMAGING [place] Ob Comp Less 14 Wks  02/18/2016  CLINICAL DATA:  Establish gestational age. EXAM: OBSTETRIC <14 WK US AND TRANSVAGINAL OB US TECHNIQUE: Both transabdominal and transvaginal ultrasound examinations were performed for complete evaluation of the gestation as well as the maternal uterus, adnexal regions, and pelvic cul-de-sac. Transvaginal technique was performed to assess early pregnancy. COMPARISON:  None. FINDINGS: Intrauterine gestational sac: Present Yolk sac:  Present Embryo:  Present Cardiac Activity: Present Heart Rate: 176  bpm CRL:  21.9  mm   8 w   6 d  [place] EDC: 09/23/2016 Subchorionic hemorrhage:  None visualized. Maternal uterus/adnexae: Normal right and left ovaries. No free fluid in the pelvis. IMPRESSION: Single live intrauterine gestation.  No subchorionic hemorrhage. Electronically Signed   By: Annia Belt M.D.   On: 02/18/2016 14:46   US Ob Transvaginal  02/18/2016  CLINICAL DATA:  Establish gestational age. EXAM: OBSTETRIC <14 WK [place] AND TRANSVAGINAL OB US TECHNIQUE: Both transabdominal and transvaginal ultrasound examinations were  performed for complete evaluation of the gestation as well as the maternal uterus, adnexal regions, and pelvic cul-de-sac. Transvaginal technique was performed to assess early pregnancy. COMPARISON:  None. FINDINGS: Intrauterine gestational sac: Present Yolk sac:  Present Embryo:  Present Cardiac Activity: Present Heart Rate: 176  bpm CRL:  21.9  mm   8 w   6 d                  [place] EDC: 09/23/2016 Subchorionic hemorrhage:  None visualized. Maternal uterus/adnexae: Normal right and left ovaries. No free fluid in the pelvis. IMPRESSION: Single live intrauterine gestation.  No subchorionic hemorrhage. Electronically Signed   By: Annia Belt M.D.   On: 02/18/2016 14:46    ASSESSMENT No diagnosis found.  PLAN  Patient advised to start/continue taking prenatal vitamins  Pregnancy confirmation letter given Patient advised to start prenatal care with [facility] provider of choice as soon as possible  Rhea Pink, CNM  02/18/2016  3:01 PM

## 2016-03-04 ENCOUNTER — Encounter (HOSPITAL_COMMUNITY): Payer: Self-pay | Admitting: Student

## 2016-03-04 ENCOUNTER — Inpatient Hospital Stay (HOSPITAL_COMMUNITY)
Admission: AD | Admit: 2016-03-04 | Discharge: 2016-03-04 | Disposition: A | Discharge status: Home / Self Care | Payer: BLUE CROSS/BLUE SHIELD | Source: Ambulatory Visit | Attending: Obstetrics and Gynecology | Admitting: Obstetrics and Gynecology

## 2016-03-04 DIAGNOSIS — O26891 Other specified pregnancy related conditions, first trimester: Secondary | ICD-10-CM | POA: Insufficient documentation

## 2016-03-04 DIAGNOSIS — Z3A10 10 weeks gestation of pregnancy: Secondary | ICD-10-CM | POA: Diagnosis not present

## 2016-03-04 DIAGNOSIS — O99511 Diseases of the respiratory system complicating pregnancy, first trimester: Secondary | ICD-10-CM | POA: Insufficient documentation

## 2016-03-04 DIAGNOSIS — Z87891 Personal history of nicotine dependence: Secondary | ICD-10-CM | POA: Diagnosis not present

## 2016-03-04 DIAGNOSIS — J45909 Unspecified asthma, uncomplicated: Secondary | ICD-10-CM | POA: Diagnosis not present

## 2016-03-04 DIAGNOSIS — R51 Headache: Secondary | ICD-10-CM | POA: Insufficient documentation

## 2016-03-04 HISTORY — DX: Migraine, unspecified, not intractable, without status migrainosus: G43.909

## 2016-03-04 LAB — URINALYSIS, ROUTINE W REFLEX MICROSCOPIC
BILIRUBIN URINE: NEGATIVE
GLUCOSE, UA: NEGATIVE mg/dL
Hgb urine dipstick: NEGATIVE
Ketones, ur: NEGATIVE mg/dL
Leukocytes, UA: NEGATIVE
NITRITE: NEGATIVE
PH: 6 (ref 5.0–8.0)
Protein, ur: NEGATIVE mg/dL
SPECIFIC GRAVITY, URINE: 1.02 (ref 1.005–1.030)

## 2016-03-04 LAB — CBC
HCT: 34.5 % — ABNORMAL LOW (ref 36.0–46.0)
HEMOGLOBIN: 12.1 g/dL (ref 12.0–15.0)
MCH: 29.9 pg (ref 26.0–34.0)
MCHC: 35.1 g/dL (ref 30.0–36.0)
MCV: 85.2 fL (ref 78.0–100.0)
PLATELETS: 193 10*3/uL (ref 150–400)
RBC: 4.05 MIL/uL (ref 3.87–5.11)
RDW: 12.8 % (ref 11.5–15.5)
WBC: 6.8 10*3/uL (ref 4.0–10.5)

## 2016-03-04 LAB — GLUCOSE, CAPILLARY: GLUCOSE-CAPILLARY: 76 mg/dL (ref 65–99)

## 2016-03-04 MED ORDER — ACETAMINOPHEN 325 MG PO TABS
650.0000 mg | ORAL_TABLET | Freq: Once | ORAL | Status: AC
  Administered 2016-03-04: 650 mg via ORAL
  Filled 2016-03-04: qty 2

## 2016-03-04 MED ORDER — BUTALBITAL-APAP-CAFFEINE 50-325-40 MG PO TABS: 1.0000 | ORAL_TABLET | Freq: Four times a day (QID) | ORAL | Status: DC | PRN

## 2016-03-04 NOTE — Discharge Instructions (Signed)

## 2016-03-04 NOTE — MAU Note (Signed)
Pt states headache started about 1100.  Hx of migraines. Pt states she feels light headed and dehydrated.

## 2016-03-04 NOTE — MAU Provider Note (Signed)
History     CSN: [id number]  Arrival date and time: 03/04/16 1524    First Provider Initiated Contact with Patient 03/04/16 1612        Chief Complaint  Patient presents with  . Headache   HPI  Samantha Hardin is a 25 y.o. G2P0010 at [redacted]w[redacted]d who presents with headache. Has history of migraines. States this headache feels like previous headaches. Current headache began at 11 am this morning. Describes as constant frontal aching. Rates 5/10. Has not treated. Denies any associated symptoms. Denies aggravating or alleviating factors.  Feels somewhat lightheaded at this time. Denies syncope.  Last ate at noon (alfredo pasta). States she feels like she's dehydrated. Denies n/v/d.   OB History    Gravida Para Term Preterm AB TAB SAB Ectopic Multiple Living        Past Medical History  Diagnosis Date  . Asthma   . Headache(784.0)     when on birth control  . Heart murmur   . Anemia   . Depression     never on meds, doing ok  . Migraines     Past Surgical History  Procedure Laterality Date  . Induced abortion      Family History  Problem Relation Age of Onset  . Anesthesia problems Neg Hx     Social History  Substance Use Topics  . Smoking status: Former Smoker -- 0.25 packs/day    Types: Cigarettes    Quit date: 10/26/2014  . Smokeless tobacco: Never Used  . Alcohol Use: No    Allergies:  Allergies  Allergen Reactions  . Latex Swelling and Rash    Prescriptions prior to admission  Medication Sig Dispense Refill Last Dose  . metoCLOPramide (REGLAN) 10 MG tablet Take 1 tablet (10 mg total) by mouth every 6 (six) hours. 30 tablet 2 Past Week at Unknown time  . Prenatal MV-Min-FA-Omega-3 (PRENATAL GUMMIES/DHA & FA) 0.4-32.5 MG CHEW Chew 2 each by mouth daily.   03/04/2016 at Unknown time  . promethazine (PHENERGAN) 25 MG tablet Take 0.5-1 tablets (12.5-25 mg total) by mouth every 6 (six) hours as needed for nausea. 30 tablet 2 Past Week at  Unknown time  . albuterol (PROVENTIL HFA;VENTOLIN HFA) 108 (90 Base) MCG/ACT inhaler Inhale 2 puffs into the lungs every 6 (six) hours as needed for wheezing or shortness of breath.   rescue  . Prenat w/o A Vit-FeFum-FePo-FA (CONCEPT OB) 130-92.4-1 MG CAPS Take 1 tablet by mouth daily. (Patient not taking: Reported on 03/04/2016) 30 capsule 12 02/04/2016 at Unknown time    Review of Systems  Constitutional: Negative.   HENT: Negative for hearing loss and tinnitus.   Gastrointestinal: Negative.   Genitourinary: Negative.   Neurological: Positive for dizziness and headaches. Negative for loss of consciousness.   Physical Exam   Blood pressure 127/60, pulse 92, temperature 99.1 F (37.3 C), temperature source Oral, resp. rate 16, height  (1.676 m), weight 149 lb 12.8 oz (67.949 kg), last menstrual period 12/24/2015, SpO2 100 %.  Physical Exam  Nursing note and vitals reviewed. Constitutional: She is oriented to person, place, and time. She appears well-developed and well-nourished. No distress.  HENT:  Head: Normocephalic and atraumatic.  Eyes: Conjunctivae are normal. Right eye exhibits no discharge. Left eye exhibits no discharge. No scleral icterus.  Neck: Normal range of motion.  Cardiovascular: Normal rate, regular rhythm and normal heart sounds.   No murmur heard. Respiratory:  Effort normal and breath sounds normal. No respiratory distress. She has no wheezes.  GI: Soft. There is no tenderness.  Neurological: She is alert and oriented to person, place, and time.  Skin: Skin is warm and dry. She is not diaphoretic.  Psychiatric: She has a normal mood and affect. Her behavior is normal. Judgment and thought content normal.    MAU Course  Procedures Results for orders placed or performed during the hospital encounter of 03/04/16 (from the past 24 hour(s))  Urinalysis, Routine w reflex microscopic (not at [facility])     Status: None   Collection Time: 03/04/16  3:30 PM  Result  Value Ref Range   Color, Urine YELLOW YELLOW   APPearance CLEAR CLEAR   Specific Gravity, Urine 1.020 1.005 - 1.030   pH 6.0 5.0 - 8.0   Glucose, UA NEGATIVE NEGATIVE mg/dL   Hgb urine dipstick NEGATIVE NEGATIVE   Bilirubin Urine NEGATIVE NEGATIVE   Ketones, ur NEGATIVE NEGATIVE mg/dL   Protein, ur NEGATIVE NEGATIVE mg/dL   Nitrite NEGATIVE NEGATIVE   Leukocytes, UA NEGATIVE NEGATIVE  Glucose, capillary     Status: None   Collection Time: 03/04/16  4:29 PM  Result Value Ref Range   Glucose-Capillary 76 65 - 99 mg/dL  CBC     Status: Abnormal   Collection Time: 03/04/16  4:45 PM  Result Value Ref Range   WBC 6.8 4.0 - 10.5 K/uL   RBC 4.05 3.87 - 5.11 MIL/uL   Hemoglobin 12.1 12.0 - 15.0 g/dL   HCT 82.934.5 (L) 56.236.0 - 13.046.0 %   MCV 85.2 78.0 - 100.0 fL   MCH 29.9 26.0 - 34.0 pg   MCHC 35.1 30.0 - 36.0 g/dL   RDW 86.512.8 78.411.5 - 69.615.5 %   Platelets 193 150 - 400 K/uL    MDM Orthostatic VS WNL CBG normal CBC, CMP Tylenol 650 mg PO Pt reports improvement in symptoms  Assessment and Plan  A: 1. Headache in pregnancy, antepartum, first trimester    P: Discharge home Rx fioricet Primarily use tylenol prn headache Discussed reasons to return Keep ob appt  Judeth Hornrin Clemente Dewey 03/04/2016, 4:12 PM

## 2016-03-17 ENCOUNTER — Encounter: Payer: BLUE CROSS/BLUE SHIELD | Admitting: Certified Nurse Midwife

## 2016-03-23 ENCOUNTER — Emergency Department (HOSPITAL_COMMUNITY)
Admission: EM | Admit: 2016-03-23 | Discharge: 2016-03-23 | Disposition: A | Discharge status: Home / Self Care | Payer: BLUE CROSS/BLUE SHIELD | Attending: Emergency Medicine | Admitting: Emergency Medicine

## 2016-03-23 ENCOUNTER — Encounter (HOSPITAL_COMMUNITY): Payer: Self-pay

## 2016-03-23 DIAGNOSIS — Z79899 Other long term (current) drug therapy: Secondary | ICD-10-CM | POA: Diagnosis not present

## 2016-03-23 DIAGNOSIS — G43909 Migraine, unspecified, not intractable, without status migrainosus: Secondary | ICD-10-CM | POA: Diagnosis not present

## 2016-03-23 DIAGNOSIS — Z3A13 13 weeks gestation of pregnancy: Secondary | ICD-10-CM | POA: Diagnosis not present

## 2016-03-23 DIAGNOSIS — Z87891 Personal history of nicotine dependence: Secondary | ICD-10-CM | POA: Diagnosis not present

## 2016-03-23 DIAGNOSIS — Z91040 Latex allergy status: Secondary | ICD-10-CM | POA: Insufficient documentation

## 2016-03-23 DIAGNOSIS — S40862A Insect bite (nonvenomous) of left upper arm, initial encounter: Secondary | ICD-10-CM | POA: Insufficient documentation

## 2016-03-23 DIAGNOSIS — O9A211 Injury, poisoning and certain other consequences of external causes complicating pregnancy, first trimester: Secondary | ICD-10-CM | POA: Insufficient documentation

## 2016-03-23 DIAGNOSIS — Z862 Personal history of diseases of the blood and blood-forming organs and certain disorders involving the immune mechanism: Secondary | ICD-10-CM | POA: Insufficient documentation

## 2016-03-23 DIAGNOSIS — Y998 Other external cause status: Secondary | ICD-10-CM | POA: Insufficient documentation

## 2016-03-23 DIAGNOSIS — O99411 Diseases of the circulatory system complicating pregnancy, first trimester: Secondary | ICD-10-CM | POA: Insufficient documentation

## 2016-03-23 DIAGNOSIS — R011 Cardiac murmur, unspecified: Secondary | ICD-10-CM | POA: Diagnosis not present

## 2016-03-23 DIAGNOSIS — Y9389 Activity, other specified: Secondary | ICD-10-CM | POA: Diagnosis not present

## 2016-03-23 DIAGNOSIS — Y92830 Public park as the place of occurrence of the external cause: Secondary | ICD-10-CM | POA: Diagnosis not present

## 2016-03-23 DIAGNOSIS — O99511 Diseases of the respiratory system complicating pregnancy, first trimester: Secondary | ICD-10-CM | POA: Diagnosis not present

## 2016-03-23 DIAGNOSIS — J45909 Unspecified asthma, uncomplicated: Secondary | ICD-10-CM | POA: Insufficient documentation

## 2016-03-23 DIAGNOSIS — Z8659 Personal history of other mental and behavioral disorders: Secondary | ICD-10-CM | POA: Insufficient documentation

## 2016-03-23 DIAGNOSIS — W57XXXA Bitten or stung by nonvenomous insect and other nonvenomous arthropods, initial encounter: Secondary | ICD-10-CM | POA: Diagnosis not present

## 2016-03-23 MED ORDER — DIPHENHYDRAMINE HCL 25 MG PO CAPS
25.0000 mg | ORAL_CAPSULE | Freq: Once | ORAL | Status: AC
  Administered 2016-03-23: 25 mg via ORAL
  Filled 2016-03-23: qty 1

## 2016-03-23 NOTE — Discharge Instructions (Signed)
Please read and follow all provided instructions.  Your diagnoses today include:  1. Insect bite    Tests performed today include:  Vital signs. See below for your results today.   Medications prescribed:   Take over the counter Benadryl as needed for symptoms. Take as prescribed   Home care instructions:  Follow any educational materials contained in this packet.  Follow-up instructions: Please follow-up with your primary care provider for further evaluation of symptoms and treatment   Return instructions:   Please return to the Emergency Department if you do not get better, if you get worse, or new symptoms OR  - Fever (temperature greater than 101.42F)  - Bleeding that does not stop with holding pressure to the area    -Severe pain (please note that you may be more sore the day after your accident)  - Chest Pain  - Difficulty breathing  - Severe nausea or vomiting  - Inability to tolerate food and liquids  - Passing out  - Skin becoming red around your wounds  - Change in mental status (confusion or lethargy)  - New numbness or weakness     Please return if you have any other emergent concerns.  Additional Information:  Your vital signs today were: BP 116/74 mmHg   Pulse 88   Temp(Src) 98.5 F (36.9 C) (Oral)   Resp 18   Ht 5\' 6"  (1.676 m)   Wt 68.947 kg   BMI 24.55 kg/m2   SpO2 99%   LMP 12/24/2015 (Approximate) If your blood pressure (BP) was elevated above 135/85 this visit, please have this repeated by your doctor within one month. ---------------

## 2016-03-23 NOTE — ED Notes (Signed)
Per PT, Pt went to a family event Saturday when she got multiple insect bites to the left arm. Throughout the last two days, the bites started to swell and pt started to notice numbness in her left arm. Pt is able to wiggle fingers. Pt reports pain at the site of bites. Reports being [redacted] weeks pregnant tomorrow. Pt alert and oriented x4 upon arrival.

## 2016-03-23 NOTE — ED Provider Notes (Signed)
CSN: [id number]     Arrival date & time 03/23/16  1052 History   First MD Initiated Contact with Patient 03/23/16 1139     Chief Complaint  Patient presents with  . Insect Bite  . Numbness   (Consider location/radiation/quality/duration/timing/severity/associated sxs/prior Treatment) HPI 25 y.o. female G2P0010 around [redacted] weeks pregnant presents to the Emergency Department today complaining of insect bites to left arm that she noticed on Sunday. States that she was at the park for a family gathering the day before. She thinks that she was bitten by mosquitos and had subsequent swelling with pain on her arm. Noted 3-4 scattered bites along arm. Pain is 3/10. Has not tried any OTC relief. Pt worried due to numbness down arm. No fevers. No CP/SOB/ABD pain. No N/V/D. No other symptoms noted  Past Medical History  Diagnosis Date  . Asthma   . Headache(784.0)     when on birth control  . Heart murmur   . Anemia   . Depression     never on meds, doing ok  . Migraines    Past Surgical History  Procedure Laterality Date  . Induced abortion     Family History  Problem Relation Age of Onset  . Anesthesia problems Neg Hx    Social History  Substance Use Topics  . Smoking status: Former Smoker -- 0.25 packs/day    Types: Cigarettes    Quit date: 10/26/2014  . Smokeless tobacco: Never Used  . Alcohol Use: No   OB History    Gravida Para Term Preterm AB TAB SAB Ectopic Multiple Living       Review of Systems ROS reviewed and all are negative for acute change except as noted in the HPI.  Allergies  Latex  Home Medications   Prior to Admission medications   Medication Sig Start Date End Date Taking? Authorizing Provider  albuterol (PROVENTIL HFA;VENTOLIN HFA) 108 (90 Base) MCG/ACT inhaler Inhale 2 puffs into the lungs every 6 (six) hours as needed for wheezing or shortness of breath.    Historical Provider, MD  butalbital-acetaminophen-caffeine (FIORICET)  [phone] MG tablet Take 1 tablet by mouth every 6 (six) hours as needed for headache. 03/04/16 03/04/17  Judeth Horn, NP  metoCLOPramide (REGLAN) 10 MG tablet Take 1 tablet (10 mg total) by mouth every 6 (six) hours. 02/04/16   Wilmer Floor Leftwich-Kirby, CNM  Prenat w/o A Vit-FeFum-FePo-FA (CONCEPT OB) 130-92.4-1 MG CAPS Take 1 tablet by mouth daily. Patient not taking: Reported on 03/04/2016 01/28/16   Dorathy Kinsman, CNM  Prenatal MV-Min-FA-Omega-3 (PRENATAL GUMMIES/DHA & FA) 0.4-32.5 MG CHEW Chew 2 each by mouth daily.    Historical Provider, MD  promethazine (PHENERGAN) 25 MG tablet Take 0.5-1 tablets (12.5-25 mg total) by mouth every 6 (six) hours as needed for nausea. 02/04/16   Misty Stanley A Leftwich-Kirby, CNM   BP 116/74 mmHg  Pulse 88  Temp(Src) 98.5 F (36.9 C) (Oral)  Resp 18  Ht  (1.676 m)  Wt 68.947 kg  BMI 24.55 kg/m2  SpO2 99%  LMP 12/24/2015 (Approximate)   Physical Exam  Constitutional: She is oriented to person, place, and time. She appears well-developed and well-nourished.  HENT:  Head: Normocephalic and atraumatic.  Eyes: EOM are normal. Pupils are equal, round, and reactive to light.  Neck: Normal range of motion. Neck supple.  Cardiovascular: Normal rate, regular rhythm and normal heart sounds.   Pulmonary/Chest: Effort normal and breath sounds normal.  Abdominal: Soft. There is no tenderness.  Musculoskeletal: Normal range of motion.  Neurological: She is alert and oriented to person, place, and time. She has normal strength. No cranial nerve deficit or sensory deficit.  Skin: Skin is warm and dry.  Left arm with 3 scattered bite marks with blanchable erythema. Each the size around 1.5cm in diameter. No purulent drainage. Minimal localized swelling. Compartments are soft. Neurovascularly intact    Psychiatric: She has a normal mood and affect. Her behavior is normal. Thought content normal.  Nursing note and vitals reviewed.  ED Course  Procedures (including critical  care time) Labs Review Labs Reviewed - No data to display  Imaging Review No results found. I have personally reviewed and evaluated these images and lab results as part of my medical decision-making.   EKG Interpretation None      MDM  I have reviewed the relevant previous healthcare records. I obtained HPI from historian. Patient discussed with supervising physician  ED Course:  Assessment: Pt is a 25yF G2P0010 who presents with insect bite around left arm. Sustained Saturday. Noticed swelling/pain on localized lesions yesterday. On exam, pt in NAD. Nontoxic/nonseptic appearing. VSS. Afebrile. Lungs CTA. Heart RRR. Abdomen nontender soft. Three localized lesions noted on left arm <1.5cm No surrounding erythema. Blanchable lesions. Non purulent. No numbness of extremity. Reflexes intact. Neurovascularly intact. Compartments soft. Plan is to DC home with benadryl and follow up with PCP. At time of discharge, Patient is in no acute distress. Vital Signs are stable. Patient is able to ambulate. Patient able to tolerate PO.    Disposition/Plan:  DC Home Additional Verbal discharge instructions given and discussed with patient.  Pt Instructed to f/u with PCP in the next week for evaluation and treatment of symptoms. Return precautions given Pt acknowledges and agrees with plan  Supervising Physician Leta BaptistEmily Roe Nguyen, MD   Final diagnoses:  Insect bite     Samantha Piliyler Daevion Navarette, PA-C 03/23/16 1258  Leta BaptistEmily Roe Nguyen, MD 03/28/16 [phone]

## 2016-04-06 ENCOUNTER — Ambulatory Visit (INDEPENDENT_AMBULATORY_CARE_PROVIDER_SITE_OTHER): Payer: BLUE CROSS/BLUE SHIELD | Admitting: Obstetrics & Gynecology

## 2016-04-06 ENCOUNTER — Encounter: Payer: Self-pay | Admitting: Obstetrics & Gynecology

## 2016-04-06 VITALS — BP 114/63 | HR 78 | Wt 152.6 lb

## 2016-04-06 DIAGNOSIS — Z113 Encounter for screening for infections with a predominantly sexual mode of transmission: Secondary | ICD-10-CM

## 2016-04-06 DIAGNOSIS — Z3689 Encounter for other specified antenatal screening: Secondary | ICD-10-CM

## 2016-04-06 DIAGNOSIS — Z124 Encounter for screening for malignant neoplasm of cervix: Secondary | ICD-10-CM

## 2016-04-06 DIAGNOSIS — Z3481 Encounter for supervision of other normal pregnancy, first trimester: Secondary | ICD-10-CM

## 2016-04-06 DIAGNOSIS — Z3492 Encounter for supervision of normal pregnancy, unspecified, second trimester: Secondary | ICD-10-CM | POA: Diagnosis not present

## 2016-04-06 LAB — POCT URINALYSIS DIP (DEVICE)
Bilirubin Urine: NEGATIVE
Glucose, UA: NEGATIVE mg/dL
HGB URINE DIPSTICK: NEGATIVE
Ketones, ur: NEGATIVE mg/dL
LEUKOCYTES UA: NEGATIVE
NITRITE: NEGATIVE
PH: 6 (ref 5.0–8.0)
PROTEIN: NEGATIVE mg/dL
Specific Gravity, Urine: 1.02 (ref 1.005–1.030)
UROBILINOGEN UA: 0.2 mg/dL (ref 0.0–1.0)

## 2016-04-06 NOTE — Progress Notes (Signed)
U/S scheduled 07/06 @ 1015am.

## 2016-04-06 NOTE — Progress Notes (Signed)
New ob packet given  Pt report white discharge with odor

## 2016-04-06 NOTE — Patient Instructions (Signed)

## 2016-04-06 NOTE — Progress Notes (Signed)
Diabetes Education: 04/06/16 Samantha Hardin was seen by me today.  I received her chart in my box which indicated she was for diabetes education.  I completed the review of GDM.  I later learned that she was however, a low risk patient and did not need to be seen for GDM education.  Nursing is to call the patient and inform her that she does not have GDM at this time and does not need to obtain the meter, check her blood glucose, or follow the restricted CHO diet.   She will need to see Nutrition at her next appointment. Maggie Nikki Rusnak, RN, RD, LDN

## 2016-04-06 NOTE — Progress Notes (Signed)
   Subjective:LMP was unsure    Samantha Hardin is a G2P0010 5837w5d being seen today for her first obstetrical visit.  Her obstetrical history is significant for h/o TAB. Patient does intend to breast feed. Pregnancy history fully reviewed.  Patient reports sharp pains low abdomen.  Filed Vitals:   04/06/16 0919  BP: 114/63  Pulse: 78  Weight: 152 lb 9.6 oz (69.219 kg)    HISTORY: OB History  Gravida Para Term Preterm AB SAB TAB Ectopic Multiple Living  2 0 0 0 1 0 1 0 0 0     # Outcome Date GA Lbr Len/2nd Weight Sex Delivery Anes PTL Lv  2 Current           1 TAB 2012             Past Medical History  Diagnosis Date  . Asthma   . Headache(784.0)     when on birth control  . Heart murmur   . Anemia   . Depression     never on meds, doing ok  . Migraines    Past Surgical History  Procedure Laterality Date  . Induced abortion     Family History  Problem Relation Age of Onset  . Anesthesia problems Neg Hx      Exam    Uterus:     Pelvic Exam:    Perineum: No Hemorrhoids   Vulva: normal   Vagina:  normal discharge, wet prep done   pH:     Cervix: no lesions   Adnexa: normal adnexa   Bony Pelvis: average  System: Breast:  normal appearance, no masses or tenderness   Skin: normal coloration and turgor, no rashes    Neurologic: oriented, normal mood   Extremities: normal strength, tone, and muscle mass   HEENT PERRLA   Mouth/Teeth dental hygiene good   Neck supple   Cardiovascular: regular rate and rhythm   Respiratory:  appears well, vitals normal, no respiratory distress, acyanotic, normal RR   Abdomen: soft, non-tender; bowel sounds normal; no masses,  no organomegaly   Urinary: urethral meatus normal      Assessment:    Pregnancy: G2P0010 Patient Active Problem List   Diagnosis Date Noted  . Encounter for supervision of other normal pregnancy in first trimester 02/18/2016        Plan:     Initial labs drawn. Prenatal vitamins. Problem  list reviewed and updated. Genetic Screening discussed Quad Screen: ordered.  Ultrasound discussed; fetal survey: ordered.  Follow up in 4 weeks. 50% of 30 min visit spent on counseling and coordination of care.     Jaelen Gellerman 04/06/2016

## 2016-04-07 ENCOUNTER — Telehealth: Payer: Self-pay | Admitting: *Deleted

## 2016-04-07 LAB — PRENATAL PROFILE (SOLSTAS)
ANTIBODY SCREEN: NEGATIVE
BASOS ABS: 0 {cells}/uL (ref 0–200)
Basophils Relative: 0 %
EOS PCT: 1 %
Eosinophils Absolute: 72 cells/uL (ref 15–500)
HCT: 37.1 % (ref 35.0–45.0)
HEMOGLOBIN: 12.4 g/dL (ref 11.7–15.5)
HIV 1&2 Ab, 4th Generation: NONREACTIVE
Hepatitis B Surface Ag: NEGATIVE
LYMPHS PCT: 16 %
Lymphs Abs: 1152 cells/uL (ref 850–3900)
MCH: 30.2 pg (ref 27.0–33.0)
MCHC: 33.4 g/dL (ref 32.0–36.0)
MCV: 90.3 fL (ref 80.0–100.0)
MONOS PCT: 9 %
MPV: 10.2 fL (ref 7.5–12.5)
Monocytes Absolute: 648 cells/uL (ref 200–950)
NEUTROS ABS: 5328 {cells}/uL (ref 1500–7800)
NEUTROS PCT: 74 %
Platelets: 205 10*3/uL (ref 140–400)
RBC: 4.11 MIL/uL (ref 3.80–5.10)
RDW: 13.6 % (ref 11.0–15.0)
RH TYPE: NEGATIVE
RUBELLA: 5.59 {index} — AB (ref <0.90)
WBC: 7.2 10*3/uL (ref 3.8–10.8)

## 2016-04-07 LAB — AFP, QUAD SCREEN
AFP: 44.5 ng/mL
CURR GEST AGE: 14.9 wk
Down Syndrome Scr Risk Est: <1:38500 {titer}
HCG, Total: 35.9 IU/mL
INH: 171 pg/mL
Interpretation-AFP: NEGATIVE
MOM FOR INH: 0.92
MoM for AFP: 1.36
MoM for hCG: 0.64
Open Spina bifida: NEGATIVE
Osb Risk: 1:8400 {titer}
Tri 18 Scr Risk Est: NEGATIVE
Trisomy 18 (Edward) Syndrome Interp.: 1:66900 {titer}
UE3 MOM: 1.4
uE3 Value: 0.85 ng/mL

## 2016-04-07 LAB — WET PREP, GENITAL
TRICH WET PREP: NONE SEEN
Yeast Wet Prep HPF POC: NONE SEEN

## 2016-04-07 LAB — CYTOLOGY - PAP

## 2016-04-07 LAB — GC/CHLAMYDIA PROBE AMP (~~LOC~~) NOT AT ARMC
Chlamydia: NEGATIVE
Neisseria Gonorrhea: NEGATIVE

## 2016-04-07 MED ORDER — METRONIDAZOLE 500 MG PO TABS: 500.0000 mg | ORAL_TABLET | Freq: Two times a day (BID) | ORAL | Status: DC

## 2016-04-07 NOTE — Telephone Encounter (Addendum)
Called pt to inform her of +BV and Rx sent to her pharmacy. No answer.   6/14  1150  Called pt and no answer. Certified letter sent to pt.

## 2016-04-08 ENCOUNTER — Encounter: Payer: Self-pay | Admitting: *Deleted

## 2016-04-11 LAB — CP5000051 PDM PROFILE
Amphetamines: NEGATIVE ng/mL (ref <500)
BUPRENORPHINE: NEGATIVE ng/mL (ref <5)
Barbiturates: NEGATIVE ng/mL (ref <300)
Benzodiazepines: NEGATIVE ng/mL (ref <100)
Cocaine Metabolite: NEGATIVE ng/mL (ref <150)
DESMETHYLTRAMADOL: NEGATIVE ng/mL (ref <100)
FENTANYL: NEGATIVE ng/mL (ref <0.5)
MDMA: NEGATIVE ng/mL (ref <500)
MEPERIDINE: NEGATIVE ng/mL (ref <100)
METHADONE METABOLITE: NEGATIVE ng/mL (ref <100)
Marijuana Metabolite: 16 ng/mL — ABNORMAL HIGH (ref <5)
Marijuana Metabolite: POSITIVE ng/mL — AB (ref <20)
Meprobamate: NEGATIVE ng/mL (ref <1000)
NORFENTANYL: NEGATIVE ng/mL (ref <0.5)
NORMEPERIDINE: NEGATIVE ng/mL (ref <100)
NORTAPENTADOL: NEGATIVE ng/mL (ref <50)
OXYCODONE: NEGATIVE ng/mL (ref <100)
Opiates: NEGATIVE ng/mL (ref <100)
Phencyclidine: NEGATIVE ng/mL (ref <25)
Propoxyphene: NEGATIVE ng/mL (ref <300)
Tapentadol: NEGATIVE ng/mL (ref <50)
Tramadol: NEGATIVE ng/mL (ref <100)
ZOLPIDEM METABOLITE: NEGATIVE ng/mL (ref <5)
ZOLPIDEM: NEGATIVE ng/mL (ref <5)

## 2016-04-17 ENCOUNTER — Telehealth: Payer: Self-pay

## 2016-04-17 NOTE — Telephone Encounter (Signed)
Pt left a message stating her FMLA paper work was not completed correctly. I have reviewed the paper work with [employer] director. FMLA leaves forms have been resubmitted to patient employers.

## 2016-04-21 ENCOUNTER — Encounter (HOSPITAL_COMMUNITY): Payer: Self-pay | Admitting: *Deleted

## 2016-04-21 ENCOUNTER — Encounter (HOSPITAL_COMMUNITY): Payer: Self-pay | Admitting: Obstetrics & Gynecology

## 2016-04-21 ENCOUNTER — Inpatient Hospital Stay (HOSPITAL_COMMUNITY)
Admission: AD | Admit: 2016-04-21 | Discharge: 2016-04-21 | Disposition: A | Discharge status: Home / Self Care | Payer: BLUE CROSS/BLUE SHIELD | Source: Ambulatory Visit | Attending: Family Medicine | Admitting: Family Medicine

## 2016-04-21 DIAGNOSIS — F329 Major depressive disorder, single episode, unspecified: Secondary | ICD-10-CM | POA: Insufficient documentation

## 2016-04-21 DIAGNOSIS — R109 Unspecified abdominal pain: Secondary | ICD-10-CM

## 2016-04-21 DIAGNOSIS — O99342 Other mental disorders complicating pregnancy, second trimester: Secondary | ICD-10-CM | POA: Diagnosis not present

## 2016-04-21 DIAGNOSIS — O26892 Other specified pregnancy related conditions, second trimester: Secondary | ICD-10-CM | POA: Diagnosis not present

## 2016-04-21 DIAGNOSIS — M545 Low back pain: Secondary | ICD-10-CM | POA: Diagnosis present

## 2016-04-21 DIAGNOSIS — R1030 Lower abdominal pain, unspecified: Secondary | ICD-10-CM | POA: Insufficient documentation

## 2016-04-21 DIAGNOSIS — N949 Unspecified condition associated with female genital organs and menstrual cycle: Secondary | ICD-10-CM | POA: Diagnosis not present

## 2016-04-21 DIAGNOSIS — R102 Pelvic and perineal pain: Secondary | ICD-10-CM | POA: Diagnosis not present

## 2016-04-21 DIAGNOSIS — G43909 Migraine, unspecified, not intractable, without status migrainosus: Secondary | ICD-10-CM | POA: Insufficient documentation

## 2016-04-21 DIAGNOSIS — Z87891 Personal history of nicotine dependence: Secondary | ICD-10-CM | POA: Diagnosis not present

## 2016-04-21 DIAGNOSIS — K5900 Constipation, unspecified: Secondary | ICD-10-CM | POA: Diagnosis not present

## 2016-04-21 DIAGNOSIS — Z3A17 17 weeks gestation of pregnancy: Secondary | ICD-10-CM | POA: Diagnosis not present

## 2016-04-21 DIAGNOSIS — J45909 Unspecified asthma, uncomplicated: Secondary | ICD-10-CM | POA: Diagnosis not present

## 2016-04-21 DIAGNOSIS — O99612 Diseases of the digestive system complicating pregnancy, second trimester: Secondary | ICD-10-CM | POA: Diagnosis not present

## 2016-04-21 DIAGNOSIS — O9989 Other specified diseases and conditions complicating pregnancy, childbirth and the puerperium: Secondary | ICD-10-CM

## 2016-04-21 DIAGNOSIS — O99512 Diseases of the respiratory system complicating pregnancy, second trimester: Secondary | ICD-10-CM | POA: Diagnosis not present

## 2016-04-21 DIAGNOSIS — O26899 Other specified pregnancy related conditions, unspecified trimester: Secondary | ICD-10-CM

## 2016-04-21 LAB — URINALYSIS, ROUTINE W REFLEX MICROSCOPIC
Bilirubin Urine: NEGATIVE
GLUCOSE, UA: NEGATIVE mg/dL
Hgb urine dipstick: NEGATIVE
Ketones, ur: NEGATIVE mg/dL
Leukocytes, UA: NEGATIVE
Nitrite: NEGATIVE
PH: 6.5 (ref 5.0–8.0)
PROTEIN: NEGATIVE mg/dL
SPECIFIC GRAVITY, URINE: 1.015 (ref 1.005–1.030)

## 2016-04-21 MED ORDER — POLYETHYLENE GLYCOL 3350 17 GM/SCOOP PO POWD: 17.0000 g | Freq: Every day | ORAL | Status: DC

## 2016-04-21 MED ORDER — DOCUSATE SODIUM 100 MG PO CAPS: 100.0000 mg | ORAL_CAPSULE | Freq: Two times a day (BID) | ORAL | Status: DC | PRN

## 2016-04-21 NOTE — Discharge Instructions (Signed)
Recommendations: Try a maternity/pregnancy support belt--[employer] or [employer] sell them online Resting, ice packs, heating pads, warm bath, or Tylenol can help round ligament pain Colace twice per day and Miralax daily. Prescriptions sent to pharmacy but medications are over-the-counter as needed.  Round Ligament Pain The round ligament is a cord of muscle and tissue that helps to support the uterus. It can become a source of pain during pregnancy if it becomes stretched or twisted as the baby grows. The pain usually begins in the second trimester of pregnancy, and it can come and go until the baby is delivered. It is not a serious problem, and it does not cause harm to the baby. Round ligament pain is usually a short, sharp, and pinching pain, but it can also be a dull, lingering, and aching pain. The pain is felt in the lower side of the abdomen or in the groin. It usually starts deep in the groin and moves up to the outside of the hip area. Pain can occur with:  A sudden change in position.  Rolling over in bed.  Coughing or sneezing.  Physical activity. HOME CARE INSTRUCTIONS Watch your condition for any changes. Take these steps to help with your pain:  When the pain starts, relax. Then try:  Sitting down.  Flexing your knees up to your abdomen.  Lying on your side with one pillow under your abdomen and another pillow between your legs.  Sitting in a warm bath for 15-20 minutes or until the pain goes away.  Take over-the-counter and prescription medicines only as told by your health care provider.  Move slowly when you sit and stand.  Avoid long walks if they cause pain.  Stop or lessen your physical activities if they cause pain. SEEK MEDICAL CARE IF:  Your pain does not go away with treatment.  You feel pain in your back that you did not have before.  Your medicine is not helping. SEEK IMMEDIATE MEDICAL CARE IF:  You develop a fever or chills.  You develop uterine  contractions.  You develop vaginal bleeding.  You develop nausea or vomiting.  You develop diarrhea.  You have pain when you urinate.   This information is not intended to replace advice given to you by your health care provider. Make sure you discuss any questions you have with your health care provider.   Document Released: 07/21/2008 Document Revised: 01/04/2012 Document Reviewed: 12/19/2014 Elsevier Interactive Patient Education 2016 [employer] Constipation, Adult Constipation is when a person has fewer than three bowel movements a week, has difficulty having a bowel movement, or has stools that are dry, hard, or larger than normal. As people grow older, constipation is more common. A low-fiber diet, not taking in enough fluids, and taking certain medicines may make constipation worse.  CAUSES   Certain medicines, such as antidepressants, pain medicine, iron supplements, antacids, and water pills.   Certain diseases, such as diabetes, irritable bowel syndrome (IBS), thyroid disease, or depression.   Not drinking enough water.   Not eating enough fiber-rich foods.   Stress or travel.   Lack of physical activity or exercise.   Ignoring the urge to have a bowel movement.   Using laxatives too much.  SIGNS AND SYMPTOMS   Having fewer than three bowel movements a week.   Straining to have a bowel movement.   Having stools that are hard, dry, or larger than normal.   Feeling full or bloated.   Pain in the lower abdomen.  Not feeling relief after having a bowel movement.  DIAGNOSIS  Your health care provider will take a medical history and perform a physical exam. Further testing may be done for severe constipation. Some tests may include:  A barium enema X-ray to examine your rectum, colon, and, sometimes, your small intestine.   A sigmoidoscopy to examine your lower colon.   A colonoscopy to examine your entire colon. TREATMENT  Treatment  will depend on the severity of your constipation and what is causing it. Some dietary treatments include drinking more fluids and eating more fiber-rich foods. Lifestyle treatments may include regular exercise. If these diet and lifestyle recommendations do not help, your health care provider may recommend taking over-the-counter laxative medicines to help you have bowel movements. Prescription medicines may be prescribed if over-the-counter medicines do not work.  HOME CARE INSTRUCTIONS   Eat foods that have a lot of fiber, such as fruits, vegetables, whole grains, and beans.  Limit foods high in fat and processed sugars, such as french fries, hamburgers, cookies, candies, and soda.   A fiber supplement may be added to your diet if you cannot get enough fiber from foods.   Drink enough fluids to keep your urine clear or pale yellow.   Exercise regularly or as directed by your health care provider.   Go to the restroom when you have the urge to go. Do not hold it.   Only take over-the-counter or prescription medicines as directed by your health care provider. Do not take other medicines for constipation without talking to your health care provider first.  SEEK IMMEDIATE MEDICAL CARE IF:   You have bright red blood in your stool.   Your constipation lasts for more than 4 days or gets worse.   You have abdominal or rectal pain.   You have thin, pencil-like stools.   You have unexplained weight loss. MAKE SURE YOU:   Understand these instructions.  Will watch your condition.  Will get help right away if you are not doing well or get worse.   This information is not intended to replace advice given to you by your health care provider. Make sure you discuss any questions you have with your health care provider.   Document Released: 07/10/2004 Document Revised: 11/02/2014 Document Reviewed: 07/24/2013 Elsevier Interactive Patient Education 2016 Elsevier Inc.  High-Fiber  Diet Fiber, also called dietary fiber, is a type of carbohydrate found in fruits, vegetables, whole grains, and beans. A high-fiber diet can have many health benefits. Your health care provider may recommend a high-fiber diet to help:  Prevent constipation. Fiber can make your bowel movements more regular.  Lower your cholesterol.  Relieve hemorrhoids, uncomplicated diverticulosis, or irritable bowel syndrome.  Prevent overeating as part of a weight-loss plan.  Prevent heart disease, type 2 diabetes, and certain cancers. WHAT IS MY PLAN? The recommended daily intake of fiber includes:  38 grams for men under age 26.  30 grams for men over age 26.  25 grams for women under age 26.  21 grams for women over age 26. You can get the recommended daily intake of dietary fiber by eating a variety of fruits, vegetables, grains, and beans. Your health care provider may also recommend a fiber supplement if it is not possible to get enough fiber through your diet. WHAT DO I NEED TO KNOW ABOUT A HIGH-FIBER DIET?  Fiber supplements have not been widely studied for their effectiveness, so it is better to get fiber through food sources.  Always check the fiber content on thenutrition facts label of any prepackaged food. Look for foods that contain at least 5 grams of fiber per serving.  Ask your dietitian if you have questions about specific foods that are related to your condition, especially if those foods are not listed in the following section.  Increase your daily fiber consumption gradually. Increasing your intake of dietary fiber too quickly may cause bloating, cramping, or gas.  Drink plenty of water. Water helps you to digest fiber. WHAT FOODS CAN I EAT? Grains Whole-grain breads. Multigrain cereal. Oats and oatmeal. Brown rice. Barley. Bulgur wheat. Millet. Bran muffins. Popcorn. Rye wafer crackers. Vegetables Sweet potatoes. Spinach. Kale. Artichokes. Cabbage. Broccoli. Green peas.  Carrots. Squash. Fruits Berries. Pears. Apples. Oranges. Avocados. Prunes and raisins. Dried figs. Meats and Other Protein Sources Navy, kidney, pinto, and soy beans. Split peas. Lentils. Nuts and seeds. Dairy Fiber-fortified yogurt. Beverages Fiber-fortified soy milk. Fiber-fortified orange juice. Other Fiber bars. The items listed above may not be a complete list of recommended foods or beverages. Contact your dietitian for more options. WHAT FOODS ARE NOT RECOMMENDED? Grains White bread. Pasta made with refined flour. White rice. Vegetables Fried potatoes. Canned vegetables. Well-cooked vegetables.  Fruits Fruit juice. Cooked, strained fruit. Meats and Other Protein Sources Fatty cuts of meat. Fried Environmental education officer or fried fish. Dairy Milk. Yogurt. Cream cheese. Sour cream. Beverages Soft drinks. Other Cakes and pastries. Butter and oils. The items listed above may not be a complete list of foods and beverages to avoid. Contact your dietitian for more information. WHAT ARE SOME TIPS FOR INCLUDING HIGH-FIBER FOODS IN MY DIET?  Eat a wide variety of high-fiber foods.  Make sure that half of all grains consumed each day are whole grains.  Replace breads and cereals made from refined flour or white flour with whole-grain breads and cereals.  Replace white rice with brown rice, bulgur wheat, or millet.  Start the day with a breakfast that is high in fiber, such as a cereal that contains at least 5 grams of fiber per serving.  Use beans in place of meat in soups, salads, or pasta.  Eat high-fiber snacks, such as berries, raw vegetables, nuts, or popcorn.   This information is not intended to replace advice given to you by your health care provider. Make sure you discuss any questions you have with your health care provider.   Document Released: 10/12/2005 Document Revised: 11/02/2014 Document Reviewed: 03/27/2014 Elsevier Interactive Patient Education [employer]

## 2016-04-21 NOTE — MAU Note (Addendum)
Pain started this morning when she got up.  Low back and lower abd.  Comes and goes, only when she stands, sharp pain in lower abd around to back.  Denies urinary problems, some bowel issues, has been 2 days

## 2016-04-21 NOTE — MAU Note (Signed)
Pt denies any chest pain 

## 2016-04-21 NOTE — MAU Provider Note (Signed)
Chief Complaint: Abdominal Pain and Back Pain   First Provider Initiated Contact with Patient 04/21/16 0845      SUBJECTIVE HPI: Samantha Hardin is a 25 y.o. G2P0010 at 713w6d by LMP who presents to maternity admissions reporting onset of lower abdominal and low back pain this morning that is intermittent, worse when walking.  She describes the pain as sharp bilateral inguinal pain that resolves when lying down or not moving.  Her last bowel movement was 3 days ago and she often goes 3-5 days.  She does lift some heavy boxes at work.  She denies urinary symptoms. She has not tried any treatments or medications. She called the office and was told to come to MAU for further evaluation. She denies vaginal bleeding, vaginal itching/burning, urinary symptoms, h/a, dizziness, n/v, or fever/chills.     HPI  Past Medical History  Diagnosis Date  . Asthma   . Headache(784.0)     when on birth control  . Heart murmur   . Anemia   . Depression     never on meds, doing ok  . Migraines    Past Surgical History  Procedure Laterality Date  . Induced abortion     Social History   Social History  . Marital Status: Single    Spouse Name: N/A  . Number of Children: N/A  . Years of Education: N/A   Occupational History  . Not on file.   Social History Main Topics  . Smoking status: Former Smoker -- 0.25 packs/day    Types: Cigarettes    Quit date: 10/26/2014  . Smokeless tobacco: Never Used  . Alcohol Use: No  . Drug Use: No  . Sexual Activity: Yes    Birth Control/ Protection: None   Other Topics Concern  . Not on file   Social History Narrative   No current facility-administered medications on file prior to encounter.   Current Outpatient Prescriptions on File Prior to Encounter  Medication Sig Dispense Refill  . Prenatal MV-Min-FA-Omega-3 (PRENATAL GUMMIES/DHA & FA) 0.4-32.5 MG CHEW Chew 2 each by mouth daily.    Marland Kitchen. albuterol (PROVENTIL HFA;VENTOLIN HFA) 108 (90 Base) MCG/ACT  inhaler Inhale 2 puffs into the lungs every 6 (six) hours as needed for wheezing or shortness of breath.    . butalbital-acetaminophen-caffeine (FIORICET) 50-325-40 MG tablet Take 1 tablet by mouth every 6 (six) hours as needed for headache. (Patient not taking: Reported on 04/21/2016) 10 tablet 0  . metoCLOPramide (REGLAN) 10 MG tablet Take 1 tablet (10 mg total) by mouth every 6 (six) hours. (Patient not taking: Reported on 04/21/2016) 30 tablet 2  . metroNIDAZOLE (FLAGYL) 500 MG tablet Take 1 tablet (500 mg total) by mouth 2 (two) times daily. (Patient not taking: Reported on 04/21/2016) 14 tablet 0  . promethazine (PHENERGAN) 25 MG tablet Take 0.5-1 tablets (12.5-25 mg total) by mouth every 6 (six) hours as needed for nausea. (Patient not taking: Reported on 04/21/2016) 30 tablet 2   Allergies  Allergen Reactions  . Latex Swelling and Rash    ROS:  Review of Systems  Constitutional: Negative for fever, chills and fatigue.  Respiratory: Negative for shortness of breath.   Cardiovascular: Negative for chest pain.  Gastrointestinal: Positive for abdominal pain and constipation. Negative for nausea and vomiting.  Genitourinary: Positive for pelvic pain. Negative for dysuria, flank pain, vaginal bleeding, vaginal discharge, difficulty urinating and vaginal pain.  Musculoskeletal: Positive for back pain.  Neurological: Negative for dizziness and headaches.  Psychiatric/Behavioral:  Negative.      I have reviewed patient's Past Medical Hx, Surgical Hx, Family Hx, Social Hx, medications and allergies.   Physical Exam  Patient Vitals for the past 24 hrs:  BP Temp Temp src Pulse Resp SpO2  04/21/16 0816 108/59 mmHg 98.7 F (37.1 C) Oral 84 16 100 %   Constitutional: Well-developed, well-nourished female in no acute distress.  Cardiovascular: normal rate Respiratory: normal effort GI: Abd soft, mild tenderness bilaterally in inguinal areas. Pos BS x 4 MS: Extremities nontender, no edema,  normal ROM Neurologic: Alert and oriented x 4.  GU: Neg CVAT.  Dilation: Closed Effacement (%): Thick Cervical Position: Posterior Exam by:: L. Leftwich-Kirby CNM  FHT 164 by doppler  LAB RESULTS Results for orders placed or performed during the hospital encounter of 04/21/16 (from the past 24 hour(s))  Urinalysis, Routine w reflex microscopic (not at [facility])     Status: None   Collection Time: 04/21/16  8:18 AM  Result Value Ref Range   Color, Urine YELLOW YELLOW   APPearance CLEAR CLEAR   Specific Gravity, Urine 1.015 1.005 - 1.030   pH 6.5 5.0 - 8.0   Glucose, UA NEGATIVE NEGATIVE mg/dL   Hgb urine dipstick NEGATIVE NEGATIVE   Bilirubin Urine NEGATIVE NEGATIVE   Ketones, ur NEGATIVE NEGATIVE mg/dL   Protein, ur NEGATIVE NEGATIVE mg/dL   Nitrite NEGATIVE NEGATIVE   Leukocytes, UA NEGATIVE NEGATIVE    AB/NEG/-- (06/12 1002)  IMAGING No results found.  MAU Management/MDM: Ordered labs and reviewed results.  Education provided about round ligament pain and constipation. Recommend pregnancy support belt/rest/ice/heat/warm bath/Tylenol for pain.  Letter provided for routine pregnancy restrictions at work.  Colace and Miralax Rx sent to pharmacy.  Pt stable at time of discharge.  ASSESSMENT 1. Pain of round ligament affecting pregnancy, antepartum   2. Constipation during pregnancy in second trimester   3. Abdominal pain affecting pregnancy, antepartum     PLAN Discharge home F/U as scheduled in [facility]    Medication List    STOP taking these medications        butalbital-acetaminophen-caffeine 50-325-40 MG tablet  Commonly known as:  FIORICET     metoCLOPramide 10 MG tablet  Commonly known as:  REGLAN     metroNIDAZOLE 500 MG tablet  Commonly known as:  FLAGYL     promethazine 25 MG tablet  Commonly known as:  PHENERGAN      TAKE these medications        acetaminophen 500 MG tablet  Commonly known as:  TYLENOL  Take 500 mg by mouth every 6 (six) hours as  needed for mild pain.     albuterol 108 (90 Base) MCG/ACT inhaler  Commonly known as:  PROVENTIL HFA;VENTOLIN HFA  Inhale 2 puffs into the lungs every 6 (six) hours as needed for wheezing or shortness of breath.     docusate sodium 100 MG capsule  Commonly known as:  COLACE  Take 1 capsule (100 mg total) by mouth 2 (two) times daily as needed.     polyethylene glycol powder powder  Commonly known as:  GLYCOLAX/MIRALAX  Take 17 g by mouth daily.     PRENATAL GUMMIES/DHA & FA 0.4-32.5 MG Chew  Chew 2 each by mouth daily.           Follow-up Information    [facility]   Specialty:  Obstetrics and Gynecology   Why:  As scheduled, Return to MAU as needed for emergencies  Contact information:   [address] [place] [address] [phone]      Sharen Counter Certified Nurse-Midwife 04/21/2016  9:17 AM

## 2016-04-24 ENCOUNTER — Telehealth: Payer: Self-pay

## 2016-04-24 NOTE — Telephone Encounter (Signed)
SEE NOTE BELOW

## 2016-04-24 NOTE — Telephone Encounter (Signed)
Pt presented to clinic today stating her FMLA paperwork was completed incorrectly. After reviewing patient paperwork at this time we will not be able to excused her from work on 05/10,05/20, 6-26,6/28,6/29, 6/30. Pt stated she was not feeling well those days so she decided to stay home. She was under the impression we could excuse her from work. However she was seen in MAU on 05/10 for headaches and 6/27 for constipation. I have advised patient she can pick up her excuse note if she would like for only 05/10 and 6/27. Patient stated she is going to lose her job because she has miss a lot of work. Patient then hung up the phone.

## 2016-04-30 ENCOUNTER — Ambulatory Visit (HOSPITAL_COMMUNITY)
Admission: RE | Admit: 2016-04-30 | Discharge: 2016-04-30 | Disposition: A | Discharge status: Home / Self Care | Payer: Medicaid Other | Source: Ambulatory Visit | Attending: Obstetrics & Gynecology | Admitting: Obstetrics & Gynecology

## 2016-04-30 ENCOUNTER — Other Ambulatory Visit: Payer: Self-pay | Admitting: Obstetrics & Gynecology

## 2016-04-30 DIAGNOSIS — Z36 Encounter for antenatal screening of mother: Secondary | ICD-10-CM | POA: Diagnosis not present

## 2016-04-30 DIAGNOSIS — Z3689 Encounter for other specified antenatal screening: Secondary | ICD-10-CM

## 2016-04-30 DIAGNOSIS — Z3A19 19 weeks gestation of pregnancy: Secondary | ICD-10-CM | POA: Diagnosis not present

## 2016-05-04 ENCOUNTER — Encounter: Payer: BLUE CROSS/BLUE SHIELD | Admitting: Obstetrics & Gynecology

## 2016-05-27 ENCOUNTER — Ambulatory Visit (INDEPENDENT_AMBULATORY_CARE_PROVIDER_SITE_OTHER): Payer: BLUE CROSS/BLUE SHIELD | Admitting: Obstetrics & Gynecology

## 2016-05-27 ENCOUNTER — Encounter: Payer: Self-pay | Admitting: Obstetrics & Gynecology

## 2016-05-27 VITALS — BP 122/77 | HR 79 | Wt 172.7 lb

## 2016-05-27 DIAGNOSIS — O26899 Other specified pregnancy related conditions, unspecified trimester: Secondary | ICD-10-CM

## 2016-05-27 DIAGNOSIS — Z3481 Encounter for supervision of other normal pregnancy, first trimester: Secondary | ICD-10-CM

## 2016-05-27 DIAGNOSIS — O360121 Maternal care for anti-D [Rh] antibodies, second trimester, fetus 1: Secondary | ICD-10-CM

## 2016-05-27 DIAGNOSIS — Z6791 Unspecified blood type, Rh negative: Secondary | ICD-10-CM | POA: Insufficient documentation

## 2016-05-27 NOTE — Progress Notes (Signed)
Subjective:  Samantha Hardin is a 25 y.o. G2P0010 at [redacted]w[redacted]d being seen today for ongoing prenatal care.  She is currently monitored for the following issues for this low-risk pregnancy and has Encounter for supervision of other normal pregnancy in first trimester and Rh negative status during pregnancy, antepartum on her problem list.  Patient reports no complaints.  Contractions: Not present. Vag. Bleeding: None.  Movement: Present. Denies leaking of fluid.   The following portions of the patient's history were reviewed and updated as appropriate: allergies, current medications, past family history, past medical history, past social history, past surgical history and problem list. Problem list updated.  Objective:   Vitals:   05/27/16 1428  BP: 122/77  Pulse: 79  Weight: 172 lb 11.2 oz (78.3 kg)    Fetal Status: Fetal Heart Rate (bpm): 148   Movement: Present     General:  Alert, oriented and cooperative. Patient is in no acute distress.  Skin: Skin is warm and dry. No rash noted.   Cardiovascular: Normal heart rate noted  Respiratory: Normal respiratory effort, no problems with respiration noted  Abdomen: Soft, gravid, appropriate for gestational age. Pain/Pressure: Present     Pelvic:  Cervical exam deferred        Extremities: Normal range of motion.  Edema: None  Mental Status: Normal mood and affect. Normal behavior. Normal judgment and thought content.   Urinalysis:      Assessment and Plan:  Pregnancy: G2P0010 at [redacted]w[redacted]d  1. Rh negative status during pregnancy, antepartum, second trimester, fetus 1 Needs rhogam at 28 weeks.   Discussed risk of RH neg status with pt  2. Encounter for supervision of other normal pregnancy in first trimester Reviewed labs and sono from last visit UA pending   Preterm labor symptoms and general obstetric precautions including but not limited to vaginal bleeding, contractions, leaking of fluid and fetal movement were reviewed in detail with  the patient. Please refer to After Visit Summary for other counseling recommendations.  Return in about 4 weeks (around 06/24/2016).   Willodean Rosenthal, MD

## 2016-05-27 NOTE — Patient Instructions (Signed)
Rh0 [D] Immune Globulin injection What is this medicine? RhO [D] IMMUNE GLOBULIN (i MYOON GLOB yoo lin) is used to treat idiopathic thrombocytopenic purpura (ITP). This medicine is used in RhO negative mothers who are pregnant with a RhO positive child. It is also used after a transfusion of RhO positive blood into a RhO negative person. This medicine may be used for other purposes; ask your health care provider or pharmacist if you have questions. What should I tell my health care provider before I take this medicine? They need to know if you have any of these conditions: -bleeding disorders -low levels of immunoglobulin A in the body -no spleen -an unusual or allergic reaction to human immune globulin, other medicines, foods, dyes, or preservatives -pregnant or trying to get pregnant -breast-feeding How should I use this medicine? This medicine is for injection into a muscle or into a vein. It is given by a health care professional in a hospital or clinic setting. Talk to your pediatrician regarding the use of this medicine in children. This medicine is not approved for use in children. Overdosage: If you think you have taken too much of this medicine contact a poison control center or emergency room at once. NOTE: This medicine is only for you. Do not share this medicine with others. What if I miss a dose? It is important not to miss your dose. Call your doctor or health care professional if you are unable to keep an appointment. What may interact with this medicine? -live virus vaccines, like measles, mumps, or rubella This list may not describe all possible interactions. Give your health care provider a list of all the medicines, herbs, non-prescription drugs, or dietary supplements you use. Also tell them if you smoke, drink alcohol, or use illegal drugs. Some items may interact with your medicine. What should I watch for while using this medicine? This medicine is made from human blood.  It may be possible to pass an infection in this medicine. Talk to your doctor about the risks and benefits of this medicine. This medicine may interfere with live virus vaccines. Before you get live virus vaccines tell your health care professional if you have received this medicine within the past 3 months. What side effects may I notice from receiving this medicine? Side effects that you should report to your doctor or health care professional as soon as possible: -allergic reactions like skin rash, itching or hives, swelling of the face, lips, or tongue -breathing problems -chest pain or tightness -yellowing of the eyes or skin Side effects that usually do not require medical attention (report to your doctor or health care professional if they continue or are bothersome): -fever -pain and tenderness at site where injected This list may not describe all possible side effects. Call your doctor for medical advice about side effects. You may report side effects to FDA at 1-800-FDA-1088. Where should I keep my medicine? This drug is given in a hospital or clinic and will not be stored at home. NOTE: This sheet is a summary. It may not cover all possible information. If you have questions about this medicine, talk to your doctor, pharmacist, or health care provider.    2016, Elsevier/Gold Standard. (2008-06-11 14:06:10) Second Trimester of Pregnancy The second trimester is from week 13 through week 28, months 4 through 6. The second trimester is often a time when you feel your best. Your body has also adjusted to being pregnant, and you begin to feel better physically. Usually,  morning sickness has lessened or quit completely, you may have more energy, and you may have an increase in appetite. The second trimester is also a time when the fetus is growing rapidly. At the end of the sixth month, the fetus is about 9 inches long and weighs about 1 pounds. You will likely begin to feel the baby move  (quickening) between 18 and 20 weeks of the pregnancy. BODY CHANGES Your body goes through many changes during pregnancy. The changes vary from woman to woman.   Your weight will continue to increase. You will notice your lower abdomen bulging out.  You may begin to get stretch marks on your hips, abdomen, and breasts.  You may develop headaches that can be relieved by medicines approved by your health care provider.  You may urinate more often because the fetus is pressing on your bladder.  You may develop or continue to have heartburn as a result of your pregnancy.  You may develop constipation because certain hormones are causing the muscles that push waste through your intestines to slow down.  You may develop hemorrhoids or swollen, bulging veins (varicose veins).  You may have back pain because of the weight gain and pregnancy hormones relaxing your joints between the bones in your pelvis and as a result of a shift in weight and the muscles that support your balance.  Your breasts will continue to grow and be tender.  Your gums may bleed and may be sensitive to brushing and flossing.  Dark spots or blotches (chloasma, mask of pregnancy) may develop on your face. This will likely fade after the baby is born.  A dark line from your belly button to the pubic area (linea nigra) may appear. This will likely fade after the baby is born.  You may have changes in your hair. These can include thickening of your hair, rapid growth, and changes in texture. Some women also have hair loss during or after pregnancy, or hair that feels dry or thin. Your hair will most likely return to normal after your baby is born. WHAT TO EXPECT AT YOUR PRENATAL VISITS During a routine prenatal visit:  You will be weighed to make sure you and the fetus are growing normally.  Your blood pressure will be taken.  Your abdomen will be measured to track your baby's growth.  The fetal heartbeat will be  listened to.  Any test results from the previous visit will be discussed. Your health care provider may ask you:  How you are feeling.  If you are feeling the baby move.  If you have had any abnormal symptoms, such as leaking fluid, bleeding, severe headaches, or abdominal cramping.  If you are using any tobacco products, including cigarettes, chewing tobacco, and electronic cigarettes.  If you have any questions. Other tests that may be performed during your second trimester include:  Blood tests that check for:  Low iron levels (anemia).  Gestational diabetes (between 24 and 28 weeks).  Rh antibodies.  Urine tests to check for infections, diabetes, or protein in the urine.  An ultrasound to confirm the proper growth and development of the baby.  An amniocentesis to check for possible genetic problems.  Fetal screens for spina bifida and Down syndrome.  HIV (human immunodeficiency virus) testing. Routine prenatal testing includes screening for HIV, unless you choose not to have this test. HOME CARE INSTRUCTIONS   Avoid all smoking, herbs, alcohol, and unprescribed drugs. These chemicals affect the formation and growth of  the baby.  Do not use any tobacco products, including cigarettes, chewing tobacco, and electronic cigarettes. If you need help quitting, ask your health care provider. You may receive counseling support and other resources to help you quit.  Follow your health care provider's instructions regarding medicine use. There are medicines that are either safe or unsafe to take during pregnancy.  Exercise only as directed by your health care provider. Experiencing uterine cramps is a good sign to stop exercising.  Continue to eat regular, healthy meals.  Wear a good support bra for breast tenderness.  Do not use hot tubs, steam rooms, or saunas.  Wear your seat belt at all times when driving.  Avoid raw meat, uncooked cheese, cat litter boxes, and soil  used by cats. These carry germs that can cause birth defects in the baby.  Take your prenatal vitamins.  Take 1500-2000 mg of calcium daily starting at the 20th week of pregnancy until you deliver your baby.  Try taking a stool softener (if your health care provider approves) if you develop constipation. Eat more high-fiber foods, such as fresh vegetables or fruit and whole grains. Drink plenty of fluids to keep your urine clear or pale yellow.  Take warm sitz baths to soothe any pain or discomfort caused by hemorrhoids. Use hemorrhoid cream if your health care provider approves.  If you develop varicose veins, wear support hose. Elevate your feet for 15 minutes, 3-4 times a day. Limit salt in your diet.  Avoid heavy lifting, wear low heel shoes, and practice good posture.  Rest with your legs elevated if you have leg cramps or low back pain.  Visit your dentist if you have not gone yet during your pregnancy. Use a soft toothbrush to brush your teeth and be gentle when you floss.  A sexual relationship may be continued unless your health care provider directs you otherwise.  Continue to go to all your prenatal visits as directed by your health care provider. SEEK MEDICAL CARE IF:   You have dizziness.  You have mild pelvic cramps, pelvic pressure, or nagging pain in the abdominal area.  You have persistent nausea, vomiting, or diarrhea.  You have a bad smelling vaginal discharge.  You have pain with urination. SEEK IMMEDIATE MEDICAL CARE IF:   You have a fever.  You are leaking fluid from your vagina.  You have spotting or bleeding from your vagina.  You have severe abdominal cramping or pain.  You have rapid weight gain or loss.  You have shortness of breath with chest pain.  You notice sudden or extreme swelling of your face, hands, ankles, feet, or legs.  You have not felt your baby move in over an hour.  You have severe headaches that do not go away with  medicine.  You have vision changes.   This information is not intended to replace advice given to you by your health care provider. Make sure you discuss any questions you have with your health care provider.   Document Released: 10/06/2001 Document Revised: 11/02/2014 Document Reviewed: 12/13/2012 Elsevier Interactive Patient Education [employer]

## 2016-06-24 ENCOUNTER — Ambulatory Visit (INDEPENDENT_AMBULATORY_CARE_PROVIDER_SITE_OTHER): Payer: BLUE CROSS/BLUE SHIELD | Admitting: Family

## 2016-06-24 VITALS — BP 125/73 | HR 83 | Wt 182.1 lb

## 2016-06-24 DIAGNOSIS — O360120 Maternal care for anti-D [Rh] antibodies, second trimester, not applicable or unspecified: Secondary | ICD-10-CM | POA: Diagnosis not present

## 2016-06-24 DIAGNOSIS — Z3482 Encounter for supervision of other normal pregnancy, second trimester: Secondary | ICD-10-CM

## 2016-06-24 DIAGNOSIS — Z3481 Encounter for supervision of other normal pregnancy, first trimester: Secondary | ICD-10-CM

## 2016-06-24 DIAGNOSIS — Z23 Encounter for immunization: Secondary | ICD-10-CM | POA: Diagnosis not present

## 2016-06-24 DIAGNOSIS — O360121 Maternal care for anti-D [Rh] antibodies, second trimester, fetus 1: Secondary | ICD-10-CM

## 2016-06-24 DIAGNOSIS — Z3492 Encounter for supervision of normal pregnancy, unspecified, second trimester: Secondary | ICD-10-CM

## 2016-06-24 LAB — POCT URINALYSIS DIP (DEVICE)
BILIRUBIN URINE: NEGATIVE
Glucose, UA: NEGATIVE mg/dL
HGB URINE DIPSTICK: NEGATIVE
KETONES UR: NEGATIVE mg/dL
Leukocytes, UA: NEGATIVE
Nitrite: NEGATIVE
PH: 6.5 (ref 5.0–8.0)
Protein, ur: NEGATIVE mg/dL
SPECIFIC GRAVITY, URINE: 1.02 (ref 1.005–1.030)
Urobilinogen, UA: 1 mg/dL (ref 0.0–1.0)

## 2016-06-24 LAB — CBC
HEMATOCRIT: 31.5 % — AB (ref 35.0–45.0)
HEMOGLOBIN: 10.4 g/dL — AB (ref 11.7–15.5)
MCH: 29.5 pg (ref 27.0–33.0)
MCHC: 33 g/dL (ref 32.0–36.0)
MCV: 89.2 fL (ref 80.0–100.0)
MPV: 11.4 fL (ref 7.5–12.5)
Platelets: 207 10*3/uL (ref 140–400)
RBC: 3.53 MIL/uL — AB (ref 3.80–5.10)
RDW: 13.3 % (ref 11.0–15.0)
WBC: 7.1 10*3/uL (ref 3.8–10.8)

## 2016-06-24 MED ORDER — TETANUS-DIPHTH-ACELL PERTUSSIS 5-2.5-18.5 LF-MCG/0.5 IM SUSP
0.5000 mL | Freq: Once | INTRAMUSCULAR | Status: AC
  Administered 2016-06-24: 0.5 mL via INTRAMUSCULAR

## 2016-06-24 MED ORDER — RHO D IMMUNE GLOBULIN 1500 UNIT/2ML IJ SOSY
300.0000 ug | PREFILLED_SYRINGE | Freq: Once | INTRAMUSCULAR | Status: AC
  Administered 2016-06-24: 300 ug via INTRAMUSCULAR

## 2016-06-24 NOTE — Patient Instructions (Signed)
AREA PEDIATRIC/FAMILY PRACTICE PHYSICIANS  [facility] [address] [place], [place] [address] Phone - [phone]   Fax - [phone]  [facility] [address] [place], [place]  [address] Phone - [phone]   Fax - [phone]  [facility] [address] [place], [place]  [address] Phone - [phone]   Fax - [phone]  [facility] [address] [place], [place]  [address] Phone - [phone]   Fax - [phone]  [facility] [address] [place], [place]  [address] Phone - [phone]   Fax - [phone]  [facility] [address] [place], [place]  [address] Phone - [phone]   Fax - [phone]  [facility] [address] [place], [place]  [address] Phone - [phone]   Fax - [phone]  [facility] [address] [place], [place]  [address] Phone - [phone]   Fax - [phone]  [facility] [address] [place], [place]  [address] Phone - [phone]   Fax - [phone] [facility] [address] [place], [place]  [address] Phone - [phone]   Fax - [phone]  [facility] [address] [place], [place]  [address] Phone - [phone]   Fax - [phone]  [facility] [address] [place], [place]  [address] Phone - [phone]   Fax - [phone]  [facility] [address] [place], [place]  [address] Phone - [phone]   Fax - [phone]  SHILPA GOSRANI [address] [place], [place]  [address] Phone - [phone]  [facility] [address] [place], [place]  [address] Phone - [phone]   Fax - [phone]  [facility] [address] [place], [place]  [address] Phone - [phone]   Fax - [phone]  [facility] [address] [place], [place]  [address] Phone - [phone]   Fax - [phone]  [facility] [address] [place], [place]  [address] Phone - [phone]   Fax - [phone]   [facility] [address] [place], [place]  [address] Phone - [phone]   Fax - [phone]  [facility] [address] , [place]  [address] Phone - [phone]   Fax - [phone]  DA[facility] RUB[facility] [address] , [place]  [address] Phone - [phone]   Fax - [phone]  [facility] [address] , [place]  [address] Phone - [phone]   Fax - [phone]  [facility] [address] , [place]  [address] Phone - [phone]   Fax - [phone] [facility] [address] [place], [place]  [address] Phone - [phone]   Fax - [phone]  [facility] [address] [place], [place]  [address] Phone - [phone]   Fax - [phone]  [facility] [address] [place], [place]  [address] Phone - [phone]   Fax - [phone]   

## 2016-06-24 NOTE — Progress Notes (Signed)
   PRENATAL VISIT NOTE  Subjective:  Samantha Hardin is a 25 y.o. G2P0010 at 6755w0d being seen today for ongoing prenatal care.  She is currently monitored for the following issues for this low-risk pregnancy and has Encounter for supervision of other normal pregnancy in first trimester and Rh negative status during pregnancy, antepartum on her problem list.  Patient reports no complaints.  Contractions: Not present. Vag. Bleeding: None.  Movement: Present. Denies leaking of fluid.   The following portions of the patient's history were reviewed and updated as appropriate: allergies, current medications, past family history, past medical history, past social history, past surgical history and problem list. Problem list updated.  Objective:   Vitals:   06/24/16 1055  BP: 125/73  Pulse: 83  Weight: 182 lb 1.6 oz (82.6 kg)    Fetal Status: Fetal Heart Rate (bpm): 150 Fundal Height: 28 cm Movement: Present     General:  Alert, oriented and cooperative. Patient is in no acute distress.  Skin: Skin is warm and dry. No rash noted.   Cardiovascular: Normal heart rate noted  Respiratory: Normal respiratory effort, no problems with respiration noted  Abdomen: Soft, gravid, appropriate for gestational age. Pain/Pressure: Present     Pelvic:  Cervical exam deferred        Extremities: Normal range of motion.  Edema: Trace  Mental Status: Normal mood and affect. Normal behavior. Normal judgment and thought content.   Urinalysis: Urine Protein: Negative Urine Glucose: Negative  Assessment and Plan:  Pregnancy: G2P0010 at 7955w0d  1. Rh negative status during pregnancy, antepartum, second trimester, fetus 1 - Rhophylac today  2. Supervision of normal pregnancy in second trimester - Glucose Tolerance, 1 HR (50g) - CBC - RPR - HIV antibody - Tdap (BOOSTRIX) injection 0.5 mL; Inject 0.5 mLs into the muscle once.  Preterm labor symptoms and general obstetric precautions including but not  limited to vaginal bleeding, contractions, leaking of fluid and fetal movement were reviewed in detail with the patient. Please refer to After Visit Summary for other counseling recommendations.  Return in about 2 weeks (around 07/08/2016).  Eino FarberWalidah Kennith GainN Karim, CNM

## 2016-06-25 LAB — HIV ANTIBODY (ROUTINE TESTING W REFLEX): HIV 1&2 Ab, 4th Generation: NONREACTIVE

## 2016-06-25 LAB — RPR

## 2016-06-25 LAB — GLUCOSE TOLERANCE, 1 HOUR (50G) W/O FASTING: GLUCOSE, 1 HR, GESTATIONAL: 103 mg/dL (ref <140)

## 2016-07-15 ENCOUNTER — Ambulatory Visit (INDEPENDENT_AMBULATORY_CARE_PROVIDER_SITE_OTHER): Payer: BLUE CROSS/BLUE SHIELD | Admitting: Family Medicine

## 2016-07-15 VITALS — BP 112/68 | HR 88 | Wt 188.0 lb

## 2016-07-15 DIAGNOSIS — Z23 Encounter for immunization: Secondary | ICD-10-CM | POA: Diagnosis not present

## 2016-07-15 DIAGNOSIS — Z3493 Encounter for supervision of normal pregnancy, unspecified, third trimester: Secondary | ICD-10-CM

## 2016-07-15 DIAGNOSIS — O360130 Maternal care for anti-D [Rh] antibodies, third trimester, not applicable or unspecified: Secondary | ICD-10-CM

## 2016-07-15 LAB — POCT URINALYSIS DIP (DEVICE)
Bilirubin Urine: NEGATIVE
Glucose, UA: NEGATIVE mg/dL
HGB URINE DIPSTICK: NEGATIVE
Ketones, ur: NEGATIVE mg/dL
LEUKOCYTES UA: NEGATIVE
Nitrite: NEGATIVE
PH: 7 (ref 5.0–8.0)
PROTEIN: NEGATIVE mg/dL
SPECIFIC GRAVITY, URINE: 1.02 (ref 1.005–1.030)
UROBILINOGEN UA: 0.2 mg/dL (ref 0.0–1.0)

## 2016-07-15 NOTE — Progress Notes (Signed)
   PRENATAL VISIT NOTE  Subjective:  Samantha Hardin is a 25 y.o. G2P0010 at 5060w0d being seen today for ongoing prenatal care.  She is currently monitored for the following issues for this low-risk pregnancy and has Encounter for supervision of other normal pregnancy in first trimester and Rh negative status during pregnancy, antepartum on her problem list.  Patient reports no complaints.  Contractions: Not present. Vag. Bleeding: None.  Movement: Present. Denies leaking of fluid.   The following portions of the patient's history were reviewed and updated as appropriate: allergies, current medications, past family history, past medical history, past social history, past surgical history and problem list. Problem list updated.  Objective:   Vitals:   07/15/16 0852  BP: 112/68  Pulse: 88  Weight: 188 lb (85.3 kg)    Fetal Status: Fetal Heart Rate (bpm): 145   Movement: Present     General:  Alert, oriented and cooperative. Patient is in no acute distress.  Skin: Skin is warm and dry. No rash noted.   Cardiovascular: Normal heart rate noted  Respiratory: Normal respiratory effort, no problems with respiration noted  Abdomen: Soft, gravid, appropriate for gestational age. Pain/Pressure: Present     Pelvic:  Cervical exam deferred        Extremities: Normal range of motion.  Edema: Trace  Mental Status: Normal mood and affect. Normal behavior. Normal judgment and thought content.   Urinalysis:      Assessment and Plan:  Pregnancy: G2P0010 at 9160w0d  1. Normal pregnancy, third trimester FHT and FH normal.  Pt still uncertain about contraception. - Flu Vaccine QUAD 36+ mos IM (Fluarix, Quad PF)  2. Rh negative status during pregnancy, antepartum, third trimester, not applicable or unspecified fetus Rhogam given last  Preterm labor symptoms and general obstetric precautions including but not limited to vaginal bleeding, contractions, leaking of fluid and fetal movement were reviewed  in detail with the patient. Please refer to After Visit Summary for other counseling recommendations.  No Follow-up on file.  Levie HeritageJacob J Izic Stfort, DO

## 2016-07-15 NOTE — Progress Notes (Signed)
Breastfeeding discussed with patient  

## 2016-07-21 ENCOUNTER — Encounter (HOSPITAL_COMMUNITY): Payer: Self-pay | Admitting: *Deleted

## 2016-07-21 ENCOUNTER — Inpatient Hospital Stay (HOSPITAL_COMMUNITY)
Admission: AD | Admit: 2016-07-21 | Discharge: 2016-07-21 | Disposition: A | Discharge status: Home / Self Care | Payer: BLUE CROSS/BLUE SHIELD | Source: Ambulatory Visit | Attending: Family Medicine | Admitting: Family Medicine

## 2016-07-21 DIAGNOSIS — O9989 Other specified diseases and conditions complicating pregnancy, childbirth and the puerperium: Secondary | ICD-10-CM

## 2016-07-21 DIAGNOSIS — Z87891 Personal history of nicotine dependence: Secondary | ICD-10-CM | POA: Diagnosis not present

## 2016-07-21 DIAGNOSIS — Z3689 Encounter for other specified antenatal screening: Secondary | ICD-10-CM

## 2016-07-21 DIAGNOSIS — Z6791 Unspecified blood type, Rh negative: Secondary | ICD-10-CM | POA: Insufficient documentation

## 2016-07-21 DIAGNOSIS — Z3A30 30 weeks gestation of pregnancy: Secondary | ICD-10-CM | POA: Insufficient documentation

## 2016-07-21 DIAGNOSIS — Z3481 Encounter for supervision of other normal pregnancy, first trimester: Secondary | ICD-10-CM

## 2016-07-21 DIAGNOSIS — M549 Dorsalgia, unspecified: Secondary | ICD-10-CM | POA: Diagnosis not present

## 2016-07-21 DIAGNOSIS — R109 Unspecified abdominal pain: Secondary | ICD-10-CM | POA: Diagnosis present

## 2016-07-21 DIAGNOSIS — S29012A Strain of muscle and tendon of back wall of thorax, initial encounter: Secondary | ICD-10-CM | POA: Diagnosis not present

## 2016-07-21 DIAGNOSIS — O360130 Maternal care for anti-D [Rh] antibodies, third trimester, not applicable or unspecified: Secondary | ICD-10-CM

## 2016-07-21 DIAGNOSIS — Z3493 Encounter for supervision of normal pregnancy, unspecified, third trimester: Secondary | ICD-10-CM

## 2016-07-21 DIAGNOSIS — T148XXA Other injury of unspecified body region, initial encounter: Secondary | ICD-10-CM

## 2016-07-21 DIAGNOSIS — O26893 Other specified pregnancy related conditions, third trimester: Secondary | ICD-10-CM | POA: Insufficient documentation

## 2016-07-21 LAB — URINALYSIS, ROUTINE W REFLEX MICROSCOPIC
Bilirubin Urine: NEGATIVE
GLUCOSE, UA: NEGATIVE mg/dL
Hgb urine dipstick: NEGATIVE
Ketones, ur: NEGATIVE mg/dL
LEUKOCYTES UA: NEGATIVE
Nitrite: NEGATIVE
PH: 6.5 (ref 5.0–8.0)
Protein, ur: NEGATIVE mg/dL
SPECIFIC GRAVITY, URINE: 1.02 (ref 1.005–1.030)

## 2016-07-21 MED ORDER — CYCLOBENZAPRINE HCL 10 MG PO TABS
10.0000 mg | ORAL_TABLET | Freq: Three times a day (TID) | ORAL | Status: DC | PRN
  Administered 2016-07-21: 10 mg via ORAL
  Filled 2016-07-21: qty 1

## 2016-07-21 MED ORDER — LACTATED RINGERS IV BOLUS (SEPSIS)
1000.0000 mL | Freq: Once | INTRAVENOUS | Status: AC
  Administered 2016-07-21: 1000 mL via INTRAVENOUS

## 2016-07-21 MED ORDER — CYCLOBENZAPRINE HCL 10 MG PO TABS: 10.0000 mg | ORAL_TABLET | Freq: Three times a day (TID) | ORAL | 0 refills | Status: DC | PRN

## 2016-07-21 NOTE — Discharge Instructions (Signed)

## 2016-07-21 NOTE — MAU Note (Signed)
Almost fell yesterday, caught herself.  Is having sharp pains down her left side.  Denies bleeding or leaking.

## 2016-07-21 NOTE — MAU Provider Note (Signed)
Chief Complaint:  Abdominal Pain   First Provider Initiated Contact with Patient 07/21/16 1427      HPI: Samantha Hardin is a 25 y.o. G3P0020 at 3577w6d who presents to maternity admissions reporting right-sided pain. She reports that yesterday at works she slipped on some water on the floor at work, but she did not fall, she was able to balance herself and did not fall, or having any impact against anything. Today, she started having sharp pain on her right side (flank/back region). This started while walking at work. Improved with rest, but started having pain again when started walking again. Denies any contractions, leakage of fluid, vaginal bleeding, or any urinary complaints. Reports good fetal movement.   Pregnancy Course:  PNC at [facility] Rh negative  Past Medical History: Past Medical History:  Diagnosis Date  . Anemia   . Asthma   . Depression    never on meds, doing ok  . Headache(784.0)    when on birth control  . Heart murmur   . Migraines     Past obstetric history: OB History  Gravida Para Term Preterm AB Living  3 0 0 0 2 0  SAB TAB Ectopic Multiple Live Births  0 2 0 0      # Outcome Date GA Lbr Len/2nd Weight Sex Delivery Anes PTL Lv  3 Current           2 TAB 2012          1 TAB               Past Surgical History: Past Surgical History:  Procedure Laterality Date  . INDUCED ABORTION       Family History: Family History  Problem Relation Age of Onset  . Heart disease Maternal Grandfather   . Diabetes Maternal Grandfather   . Hypertension Maternal Grandfather   . Anesthesia problems Neg Hx     Social History: Social History  Substance Use Topics  . Smoking status: Former Smoker    Packs/day: 0.25    Types: Cigarettes    Quit date: 10/26/2014  . Smokeless tobacco: Never Used  . Alcohol use No    Allergies:  Allergies  Allergen Reactions  . Latex Swelling and Rash    Meds:  Prescriptions Prior to Admission  Medication Sig Dispense Refill  Last Dose  . acetaminophen (TYLENOL) 500 MG tablet Take 500 mg by mouth every 6 (six) hours as needed for mild pain.   Past Month at Unknown time  . albuterol (PROVENTIL HFA;VENTOLIN HFA) 108 (90 Base) MCG/ACT inhaler Inhale 2 puffs into the lungs every 6 (six) hours as needed for wheezing or shortness of breath.   Past Month at Unknown time  . docusate sodium (COLACE) 100 MG capsule Take 1 capsule (100 mg total) by mouth 2 (two) times daily as needed. 30 capsule 2 Past Month at Unknown time  . polyethylene glycol powder (GLYCOLAX/MIRALAX) powder Take 17 g by mouth daily. 255 g 0 Past Month at Unknown time  . Prenatal MV-Min-FA-Omega-3 (PRENATAL GUMMIES/DHA & FA) 0.4-32.5 MG CHEW Chew 2 each by mouth daily.   Past Week at Unknown time    I have reviewed patient's Past Medical Hx, Surgical Hx, Family Hx, Social Hx, medications and allergies.   ROS:  A comprehensive ROS was negative except per HPI.    Physical Exam  Patient Vitals for the past 24 hrs:  BP Temp Temp src Pulse Resp  07/21/16 1250 128/72 98.2 F (  36.8 C) Oral 97 16   Constitutional: Well-developed, well-nourished female in no acute distress.  Cardiovascular: normal rate Respiratory: normal effort GI: Abd soft, non-tender, gravid appropriate for gestational age. Pos BS x 4 MSK: Extremities nontender, no edema, normal ROM. TTP on R mid-back Neurologic: Alert and oriented x 4.  GU: Neg CVAT.  Dilation: Closed Effacement (%): Thick Cervical Position: Posterior Exam by:: Dr. Nira Retort  FHT:  Baseline 140 , moderate variability, accelerations present, no decelerations Contractions: occasional (pt not feeling them)   Labs: Results for orders placed or performed during the hospital encounter of 07/21/16 (from the past 24 hour(s))  Urinalysis, Routine w reflex microscopic (not at [facility])     Status: None   Collection Time: 07/21/16 12:40 PM  Result Value Ref Range   Color, Urine YELLOW YELLOW   APPearance CLEAR CLEAR    Specific Gravity, Urine 1.020 1.005 - 1.030   pH 6.5 5.0 - 8.0   Glucose, UA NEGATIVE NEGATIVE mg/dL   Hgb urine dipstick NEGATIVE NEGATIVE   Bilirubin Urine NEGATIVE NEGATIVE   Ketones, ur NEGATIVE NEGATIVE mg/dL   Protein, ur NEGATIVE NEGATIVE mg/dL   Nitrite NEGATIVE NEGATIVE   Leukocytes, UA NEGATIVE NEGATIVE    Imaging:  No results found.  MAU Course: - Pt seen and evaluated. Occasional ctx on monitor, but pt not feeling them. SVE reassuring.  - IVFs and flexeril given - On re-evaluation, pain improved with flexeril. Occasional ctx improved.   MDM: Plan of care reviewed with patient, including labs and tests ordered and medical treatment.   Assessment: 25 y.o. G3P0020 with IUP at [redacted]w[redacted]d who presents for R-sided back pain. No trauma. Likely muscle strain; improved with flexeril. Occasional ctx here, but pt not feeling them. SVE reassuring, and ctx improved with IVFs.  1. Muscle strain   2. Rh negative status during pregnancy, antepartum, third trimester, not applicable or unspecified fetus   3. Encounter for supervision of other normal pregnancy in first trimester    Plan: - Discharge home in stable condition.  - Rx for flexeril  - Labor precautions and fetal kick counts reviewed with patient   Future Appointments Date Time Provider Department Center  07/30/2016 7:40 AM Hermina Staggers, MD [facility]    Frederik Pear, MD 07/21/2016 4:06 PM   Midwife attestation:  I have seen and examined this patient; I agree with above documentation in the resident's note.   Samantha Hardin is a 25 y.o. G3P0020 reporting right flank pain +FM, denies LOF, VB, contractions, vaginal discharge.  PE: BP 122/70 (BP Location: Left Arm)   Pulse 92   Temp 98.2 F (36.8 C) (Oral)   Resp 18   LMP 12/24/2015 (Approximate)  Gen: calm comfortable, NAD Resp: normal effort, no distress Abd: gravid  ROS, labs, PMH reviewed NST reactive  A/P: 30.[redacted] weeks gestation MSK  strain Reactive NST Follow up in office as scheduled  Donette Larry, CNM  7:07 PM

## 2016-07-30 ENCOUNTER — Ambulatory Visit (INDEPENDENT_AMBULATORY_CARE_PROVIDER_SITE_OTHER): Payer: BLUE CROSS/BLUE SHIELD | Admitting: Obstetrics and Gynecology

## 2016-07-30 VITALS — BP 121/72 | HR 87 | Wt 190.1 lb

## 2016-07-30 DIAGNOSIS — O26899 Other specified pregnancy related conditions, unspecified trimester: Secondary | ICD-10-CM

## 2016-07-30 DIAGNOSIS — Z6791 Unspecified blood type, Rh negative: Secondary | ICD-10-CM

## 2016-07-30 DIAGNOSIS — Z3481 Encounter for supervision of other normal pregnancy, first trimester: Secondary | ICD-10-CM

## 2016-07-30 NOTE — Progress Notes (Signed)
Discussed signs of preterm labor and labor.

## 2016-07-30 NOTE — Progress Notes (Signed)
Subjective:  Samantha Hardin is a 25 y.o. G3P0020 at 5635w1d being seen today for ongoing prenatal care.  She is currently monitored for the following issues for this low-risk pregnancy and has Encounter for supervision of other normal pregnancy in first trimester and Rh negative, antepartum on her problem list.  Patient reports no complaints.  Contractions: Not present. Vag. Bleeding: None.  Movement: Present. Denies leaking of fluid.   The following portions of the patient's history were reviewed and updated as appropriate: allergies, current medications, past family history, past medical history, past social history, past surgical history and problem list. Problem list updated.  Objective:   Vitals:   07/30/16 0811  BP: 121/72  Pulse: 87  Weight: 190 lb 1.6 oz (86.2 kg)    Fetal Status: Fetal Heart Rate (bpm): 142   Movement: Present     General:  Alert, oriented and cooperative. Patient is in no acute distress.  Skin: Skin is warm and dry. No rash noted.   Cardiovascular: Normal heart rate noted  Respiratory: Normal respiratory effort, no problems with respiration noted  Abdomen: Soft, gravid, appropriate for gestational age. Pain/Pressure: Present     Pelvic:  Cervical exam deferred        Extremities: Normal range of motion.  Edema: Trace  Mental Status: Normal mood and affect. Normal behavior. Normal judgment and thought content.   Urinalysis:      Assessment and Plan:  Pregnancy: G3P0020 at 7635w1d  1. Encounter for supervision of other normal pregnancy in first trimester   2. Rh negative, antepartum   Preterm labor symptoms and general obstetric precautions including but not limited to vaginal bleeding, contractions, leaking of fluid and fetal movement were reviewed in detail with the patient. Please refer to After Visit Summary for other counseling recommendations.  Return in about 2 weeks (around 08/13/2016) for OB visit.   Hermina StaggersMichael L Ervin, MD

## 2016-08-13 ENCOUNTER — Ambulatory Visit (INDEPENDENT_AMBULATORY_CARE_PROVIDER_SITE_OTHER): Payer: BLUE CROSS/BLUE SHIELD | Admitting: Obstetrics and Gynecology

## 2016-08-13 VITALS — BP 114/77 | HR 91 | Wt 197.8 lb

## 2016-08-13 DIAGNOSIS — Z3481 Encounter for supervision of other normal pregnancy, first trimester: Secondary | ICD-10-CM

## 2016-08-13 DIAGNOSIS — Z6791 Unspecified blood type, Rh negative: Secondary | ICD-10-CM

## 2016-08-13 DIAGNOSIS — O360930 Maternal care for other rhesus isoimmunization, third trimester, not applicable or unspecified: Secondary | ICD-10-CM

## 2016-08-13 DIAGNOSIS — O26899 Other specified pregnancy related conditions, unspecified trimester: Secondary | ICD-10-CM

## 2016-08-13 NOTE — Progress Notes (Signed)
Subjective:  Samantha Hardin is a 25 y.o. G3P0020 at 2362w1d being seen today for ongoing prenatal care.  She is currently monitored for the following issues for this low-risk pregnancy and has Encounter for supervision of other normal pregnancy in first trimester and Rh negative, antepartum on her problem list.  Patient reports general discomforts of pregnancy.  Contractions: Not present. Vag. Bleeding: None.  Movement: Present. Denies leaking of fluid.   The following portions of the patient's history were reviewed and updated as appropriate: allergies, current medications, past family history, past medical history, past social history, past surgical history and problem list. Problem list updated.  Objective:   Vitals:   08/13/16 0951  BP: 114/77  Pulse: 91  Weight: 197 lb 12.8 oz (89.7 kg)    Fetal Status: Fetal Heart Rate (bpm): 130   Movement: Present     General:  Alert, oriented and cooperative. Patient is in no acute distress.  Skin: Skin is warm and dry. No rash noted.   Cardiovascular: Normal heart rate noted  Respiratory: Normal respiratory effort, no problems with respiration noted  Abdomen: Soft, gravid, appropriate for gestational age. Pain/Pressure: Present     Pelvic:  Cervical exam deferred        Extremities: Normal range of motion.  Edema: Trace  Mental Status: Normal mood and affect. Normal behavior. Normal judgment and thought content.   Urinalysis:      Assessment and Plan:  Pregnancy: G3P0020 at 3962w1d  1. Encounter for supervision of other normal pregnancy in first trimester Stable. Peds list provided  2. Rh negative, antepartum Has received Rhogam, F/U after delivery  Preterm labor symptoms and general obstetric precautions including but not limited to vaginal bleeding, contractions, leaking of fluid and fetal movement were reviewed in detail with the patient. Please refer to After Visit Summary for other counseling recommendations.  No Follow-up on  file.   Hermina StaggersMichael L Shereena Berquist, MD

## 2016-08-13 NOTE — Patient Instructions (Addendum)
AREA PEDIATRIC/FAMILY PRACTICE PHYSICIANS  [facility] [address] [place], [place] [address] Phone - [phone]   Fax - [phone]  [facility] [address] [place], [place]  [address] Phone - [phone]   Fax - [phone]  [facility] [address] [place], [place]  [address] Phone - [phone]   Fax - [phone]  [facility] [address] [place], [place]  [address] Phone - [phone]   Fax - [phone]  [facility] [address] [place], [place]  [address] Phone - [phone]   Fax - [phone]  [facility] [address], [address] [place], [place]  [address] Phone - [phone]   Fax - [phone]  [facility] [address] [place], [place]  [address] Phone - [phone]   Fax - [phone]  [facility] [address], [address] [place], [place]  [address] Phone - [phone]   Fax - [phone]  [facility] [address] [place], [place]  [address] Phone - [phone]   Fax - [phone] [facility] [address] [place], [place]  [address] Phone - [phone]   Fax - [phone]  [facility] [address] [place], [place]  [address] Phone - [phone]   Fax - [phone]  [facility] [address] [place], [place]  [address] Phone - [phone]   Fax - [phone]  [facility] [address] [place], [place]  [address] Phone - [phone]   Fax - [phone]  [facility] GOSR[facility] [address] [place], [place]  [address] Phone - [phone]  [facility] [address] [place], [place]  [address] Phone - [phone]   Fax - [phone]  [facility] [address] [place], [place]  [address] Phone - [phone]   Fax - [phone]  [facility] [address] [place], [place]  [address] Phone - [phone]   Fax - [phone]  [facility] [address] [place], [place]  [address] Phone - [phone]   Fax - [phone]   [facility] [address] [place], [place]  [address] Phone - [phone]   Fax - [phone]  [facility] [address] [place], [place]  [address] Phone - [phone]   Fax - [phone]  [facility] RUB[facility] [address] [place], [place]  [address] Phone - [phone]   Fax - [phone]  [facility] [address] , [place]  [address] Phone - [phone]   Fax - [phone]  [facility] [address] , [place]  [address] Phone - [phone]   Fax - [phone] [facility] [address] [place], [place]  [address] Phone - [phone]   Fax - [phone]  [facility] [address] [place], [place]  [address] Phone - [phone]   Fax - [phone]  [facility] [address] [place],   [address] Phone - [phone]   Fax - [phone]   

## 2016-08-13 NOTE — Progress Notes (Signed)
C/o vaginal pain and right hip and right thigh pain.

## 2016-08-21 ENCOUNTER — Inpatient Hospital Stay (HOSPITAL_COMMUNITY)
Admission: AD | Admit: 2016-08-21 | Discharge: 2016-08-21 | Disposition: A | Discharge status: Home / Self Care | Payer: BLUE CROSS/BLUE SHIELD | Source: Ambulatory Visit | Attending: Obstetrics and Gynecology | Admitting: Obstetrics and Gynecology

## 2016-08-21 ENCOUNTER — Encounter (HOSPITAL_COMMUNITY): Payer: Self-pay

## 2016-08-21 DIAGNOSIS — Z3483 Encounter for supervision of other normal pregnancy, third trimester: Secondary | ICD-10-CM | POA: Insufficient documentation

## 2016-08-21 DIAGNOSIS — Z6791 Unspecified blood type, Rh negative: Secondary | ICD-10-CM

## 2016-08-21 DIAGNOSIS — O26899 Other specified pregnancy related conditions, unspecified trimester: Secondary | ICD-10-CM

## 2016-08-21 DIAGNOSIS — Z3A00 Weeks of gestation of pregnancy not specified: Secondary | ICD-10-CM | POA: Diagnosis not present

## 2016-08-21 DIAGNOSIS — Z3481 Encounter for supervision of other normal pregnancy, first trimester: Secondary | ICD-10-CM

## 2016-08-21 LAB — URINALYSIS, ROUTINE W REFLEX MICROSCOPIC
BILIRUBIN URINE: NEGATIVE
Glucose, UA: NEGATIVE mg/dL
HGB URINE DIPSTICK: NEGATIVE
KETONES UR: NEGATIVE mg/dL
Leukocytes, UA: NEGATIVE
NITRITE: NEGATIVE
PROTEIN: NEGATIVE mg/dL
Specific Gravity, Urine: 1.01 (ref 1.005–1.030)
pH: 6.5 (ref 5.0–8.0)

## 2016-08-21 NOTE — MAU Note (Addendum)
Have sharp pains both sides of lower abdomen since 1700. Denies LOF or bleeding. Some "snotty" d/c. Pain is worse with walking

## 2016-08-21 NOTE — Discharge Instructions (Signed)
Fetal Movement Counts °Patient Name: __________________________________________________ Patient Due Date: ____________________ °Performing a fetal movement count is highly recommended in high-risk pregnancies, but it is good for every pregnant woman to do. Your health care provider may ask you to start counting fetal movements at 28 weeks of the pregnancy. Fetal movements often increase: °· After eating a full meal. °· After physical activity. °· After eating or drinking something sweet or cold. °· At rest. °Pay attention to when you feel the baby is most active. This will help you notice a pattern of your baby's sleep and wake cycles and what factors contribute to an increase in fetal movement. It is important to perform a fetal movement count at the same time each day when your baby is normally most active.  °HOW TO COUNT FETAL MOVEMENTS °1. Find a quiet and comfortable area to sit or lie down on your left side. Lying on your left side provides the best blood and oxygen circulation to your baby. °2. Write down the day and time on a sheet of paper or in a journal. °3. Start counting kicks, flutters, swishes, rolls, or jabs in a 2-hour period. You should feel at least 10 movements within 2 hours. °4. If you do not feel 10 movements in 2 hours, wait 2-3 hours and count again. Look for a change in the pattern or not enough counts in 2 hours. °SEEK MEDICAL CARE IF: °· You feel less than 10 counts in 2 hours, tried twice. °· There is no movement in over an hour. °· The pattern is changing or taking longer each day to reach 10 counts in 2 hours. °· You feel the baby is not moving as he or she usually does. °Date: ____________ Movements: ____________ Start time: ____________ Finish time: ____________  °Date: ____________ Movements: ____________ Start time: ____________ Finish time: ____________ °Date: ____________ Movements: ____________ Start time: ____________ Finish time: ____________ °Date: ____________ Movements:  ____________ Start time: ____________ Finish time: ____________ °Date: ____________ Movements: ____________ Start time: ____________ Finish time: ____________ °Date: ____________ Movements: ____________ Start time: ____________ Finish time: ____________ °Date: ____________ Movements: ____________ Start time: ____________ Finish time: ____________ °Date: ____________ Movements: ____________ Start time: ____________ Finish time: ____________  °Date: ____________ Movements: ____________ Start time: ____________ Finish time: ____________ °Date: ____________ Movements: ____________ Start time: ____________ Finish time: ____________ °Date: ____________ Movements: ____________ Start time: ____________ Finish time: ____________ °Date: ____________ Movements: ____________ Start time: ____________ Finish time: ____________ °Date: ____________ Movements: ____________ Start time: ____________ Finish time: ____________ °Date: ____________ Movements: ____________ Start time: ____________ Finish time: ____________ °Date: ____________ Movements: ____________ Start time: ____________ Finish time: ____________  °Date: ____________ Movements: ____________ Start time: ____________ Finish time: ____________ °Date: ____________ Movements: ____________ Start time: ____________ Finish time: ____________ °Date: ____________ Movements: ____________ Start time: ____________ Finish time: ____________ °Date: ____________ Movements: ____________ Start time: ____________ Finish time: ____________ °Date: ____________ Movements: ____________ Start time: ____________ Finish time: ____________ °Date: ____________ Movements: ____________ Start time: ____________ Finish time: ____________ °Date: ____________ Movements: ____________ Start time: ____________ Finish time: ____________  °Date: ____________ Movements: ____________ Start time: ____________ Finish time: ____________ °Date: ____________ Movements: ____________ Start time: ____________ Finish  time: ____________ °Date: ____________ Movements: ____________ Start time: ____________ Finish time: ____________ °Date: ____________ Movements: ____________ Start time: ____________ Finish time: ____________ °Date: ____________ Movements: ____________ Start time: ____________ Finish time: ____________ °Date: ____________ Movements: ____________ Start time: ____________ Finish time: ____________ °Date: ____________ Movements: ____________ Start time: ____________ Finish time: ____________  °Date: ____________ Movements: ____________ Start time: ____________ Finish   time: ____________ °Date: ____________ Movements: ____________ Start time: ____________ Finish time: ____________ °Date: ____________ Movements: ____________ Start time: ____________ Finish time: ____________ °Date: ____________ Movements: ____________ Start time: ____________ Finish time: ____________ °Date: ____________ Movements: ____________ Start time: ____________ Finish time: ____________ °Date: ____________ Movements: ____________ Start time: ____________ Finish time: ____________ °Date: ____________ Movements: ____________ Start time: ____________ Finish time: ____________  °Date: ____________ Movements: ____________ Start time: ____________ Finish time: ____________ °Date: ____________ Movements: ____________ Start time: ____________ Finish time: ____________ °Date: ____________ Movements: ____________ Start time: ____________ Finish time: ____________ °Date: ____________ Movements: ____________ Start time: ____________ Finish time: ____________ °Date: ____________ Movements: ____________ Start time: ____________ Finish time: ____________ °Date: ____________ Movements: ____________ Start time: ____________ Finish time: ____________ °Date: ____________ Movements: ____________ Start time: ____________ Finish time: ____________  °Date: ____________ Movements: ____________ Start time: ____________ Finish time: ____________ °Date: ____________  Movements: ____________ Start time: ____________ Finish time: ____________ °Date: ____________ Movements: ____________ Start time: ____________ Finish time: ____________ °Date: ____________ Movements: ____________ Start time: ____________ Finish time: ____________ °Date: ____________ Movements: ____________ Start time: ____________ Finish time: ____________ °Date: ____________ Movements: ____________ Start time: ____________ Finish time: ____________ °Date: ____________ Movements: ____________ Start time: ____________ Finish time: ____________  °Date: ____________ Movements: ____________ Start time: ____________ Finish time: ____________ °Date: ____________ Movements: ____________ Start time: ____________ Finish time: ____________ °Date: ____________ Movements: ____________ Start time: ____________ Finish time: ____________ °Date: ____________ Movements: ____________ Start time: ____________ Finish time: ____________ °Date: ____________ Movements: ____________ Start time: ____________ Finish time: ____________ °Date: ____________ Movements: ____________ Start time: ____________ Finish time: ____________ °  °This information is not intended to replace advice given to you by your health care provider. Make sure you discuss any questions you have with your health care provider. °  °Document Released: 11/11/2006 Document Revised: 11/02/2014 Document Reviewed: 08/08/2012 °Elsevier Interactive Patient Education ©2016 [employer] °Braxton Hicks Contractions °Contractions of the uterus can occur throughout pregnancy. Contractions are not always a sign that you are in labor.  °WHAT ARE BRAXTON HICKS CONTRACTIONS?  °Contractions that occur before labor are called Braxton Hicks contractions, or false labor. Toward the end of pregnancy (32-34 weeks), these contractions can develop more often and may become more forceful. This is not true labor because these contractions do not result in opening (dilatation) and thinning of  the cervix. They are sometimes difficult to tell apart from true labor because these contractions can be forceful and people have different pain tolerances. You should not feel embarrassed if you go to the hospital with false labor. Sometimes, the only way to tell if you are in true labor is for your health care provider to look for changes in the cervix. °If there are no prenatal problems or other health problems associated with the pregnancy, it is completely safe to be sent home with false labor and await the onset of true labor. °HOW CAN YOU TELL THE DIFFERENCE BETWEEN TRUE AND FALSE LABOR? °False Labor °· The contractions of false labor are usually shorter and not as hard as those of true labor.   °· The contractions are usually irregular.   °· The contractions are often felt in the front of the lower abdomen and in the groin.   °· The contractions may go away when you walk around or change positions while lying down.   °· The contractions get weaker and are shorter lasting as time goes on.   °· The contractions do not usually become progressively stronger, regular, and closer together as with true labor.   °True Labor °· Contractions in true   labor last 30-70 seconds, become very regular, usually become more intense, and increase in frequency.   °· The contractions do not go away with walking.   °· The discomfort is usually felt in the top of the uterus and spreads to the lower abdomen and low back.   °· True labor can be determined by your health care provider with an exam. This will show that the cervix is dilating and getting thinner.   °WHAT TO REMEMBER °· Keep up with your usual exercises and follow other instructions given by your health care provider.   °· Take medicines as directed by your health care provider.   °· Keep your regular prenatal appointments.   °· Eat and drink lightly if you think you are going into labor.   °· If Braxton Hicks contractions are making you uncomfortable:   °¨ Change your  position from lying down or resting to walking, or from walking to resting.   °¨ Sit and rest in a tub of warm water.   °¨ Drink 2-3 glasses of water. Dehydration may cause these contractions.   °¨ Do slow and deep breathing several times an hour.   °WHEN SHOULD I SEEK IMMEDIATE MEDICAL CARE? °Seek immediate medical care if: °· Your contractions become stronger, more regular, and closer together.   °· You have fluid leaking or gushing from your vagina.   °· You have a fever.   °· You pass blood-tinged mucus.   °· You have vaginal bleeding.   °· You have continuous abdominal pain.   °· You have low back pain that you never had before.   °· You feel your baby's head pushing down and causing pelvic pressure.   °· Your baby is not moving as much as it used to.   °  °This information is not intended to replace advice given to you by your health care provider. Make sure you discuss any questions you have with your health care provider. °  °Document Released: 10/12/2005 Document Revised: 10/17/2013 Document Reviewed: 07/24/2013 °Elsevier Interactive Patient Education ©2016 Elsevier Inc. ° °

## 2016-08-31 ENCOUNTER — Ambulatory Visit (INDEPENDENT_AMBULATORY_CARE_PROVIDER_SITE_OTHER): Payer: BLUE CROSS/BLUE SHIELD | Admitting: Obstetrics & Gynecology

## 2016-08-31 VITALS — BP 138/80 | HR 84 | Wt 208.1 lb

## 2016-08-31 DIAGNOSIS — O26899 Other specified pregnancy related conditions, unspecified trimester: Secondary | ICD-10-CM

## 2016-08-31 DIAGNOSIS — Z3481 Encounter for supervision of other normal pregnancy, first trimester: Secondary | ICD-10-CM

## 2016-08-31 DIAGNOSIS — Z113 Encounter for screening for infections with a predominantly sexual mode of transmission: Secondary | ICD-10-CM | POA: Diagnosis not present

## 2016-08-31 DIAGNOSIS — Z6791 Unspecified blood type, Rh negative: Secondary | ICD-10-CM

## 2016-08-31 DIAGNOSIS — O0993 Supervision of high risk pregnancy, unspecified, third trimester: Secondary | ICD-10-CM

## 2016-08-31 LAB — POCT URINALYSIS DIP (DEVICE)
BILIRUBIN URINE: NEGATIVE
GLUCOSE, UA: NEGATIVE mg/dL
Hgb urine dipstick: NEGATIVE
KETONES UR: NEGATIVE mg/dL
Leukocytes, UA: NEGATIVE
Nitrite: NEGATIVE
Protein, ur: NEGATIVE mg/dL
SPECIFIC GRAVITY, URINE: 1.015 (ref 1.005–1.030)
Urobilinogen, UA: 0.2 mg/dL (ref 0.0–1.0)
pH: 7 (ref 5.0–8.0)

## 2016-08-31 LAB — OB RESULTS CONSOLE GBS: GBS: NEGATIVE

## 2016-08-31 NOTE — Progress Notes (Signed)
   PRENATAL VISIT NOTE  Subjective:  Samantha Hardin is a 25 y.o. G3P0020 at 7659w5d being seen today for ongoing prenatal care.  She is currently monitored for the following issues for this low-risk pregnancy and has Encounter for supervision of other normal pregnancy in first trimester and Rh negative, antepartum on her problem list.  Patient reports lower leg swelling- bilaterally symmetric.  Contractions: Irritability. Vag. Bleeding: None.  Movement: Present. Denies leaking of fluid.  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  She reports that the swelling did develop after she started this tasting. She also reports eating hot dogs and pickles. Lots of high sodium foods.  The following portions of the patient's history were reviewed and updated as appropriate: allergies, current medications, past family history, past medical history, past social history, past surgical history and problem list. Problem list updated.  Objective:   Vitals:   08/31/16 1603  BP: 138/80  Pulse: 84  Weight: 208 lb 1.6 oz (94.4 kg)    Fetal Status: Fetal Heart Rate (bpm): 145 Fundal Height: 41 cm Movement: Present     General:  Alert, oriented and cooperative. Patient is in no acute distress.  Skin: Skin is warm and dry. No rash noted.   Cardiovascular: Normal heart rate noted  Respiratory: Normal respiratory effort, no problems with respiration noted  Abdomen: Soft, gravid, appropriate for gestational age. Pain/Pressure: Present     Pelvic:  Cervical exam performed        Extremities: Normal range of motion.  Edema: Trace  Mental Status: Normal mood and affect. Normal behavior. Normal judgment and thought content.   Assessment and Plan:  Pregnancy: G3P0020 at 5259w5d  1. Pregnancy, supervision, high-risk, third trimester  - Culture, beta strep (group b only) - GC/Chlamydia probe amp ([place] at [facility] - US MFM OB FOLLOW UP; Future  2. Encounter for supervision of  other normal pregnancy in first trimester  3. Rh negative, antepartum S/p Rhogam  4. S>D For sono to check EFW  5. Lower extremity edema- Discussed with pt need to cut back on the sodium in her diet.  SHe is to f/u in 1 week but was told that if she develops HA, visual changes or pain in her abd she should f/u sooner prn  Preterm labor symptoms and general obstetric precautions including but not limited to vaginal bleeding, contractions, leaking of fluid and fetal movement were reviewed in detail with the patient. Please refer to After Visit Summary for other counseling recommendations.  Return in about 2 weeks (around 09/14/2016).   Willodean Rosenthalarolyn Harraway-Smith, MD

## 2016-08-31 NOTE — Progress Notes (Signed)
36 wk cultures today   

## 2016-08-31 NOTE — Patient Instructions (Signed)

## 2016-09-01 ENCOUNTER — Telehealth: Payer: Self-pay

## 2016-09-01 NOTE — Telephone Encounter (Signed)
Attempted to contact pt and was unable to LM due to VM box not being set up.  Pt needs to be informed of her OB US scheduled on November 13th @ 1315.

## 2016-09-02 LAB — GC/CHLAMYDIA PROBE AMP (~~LOC~~) NOT AT ARMC
CHLAMYDIA, DNA PROBE: NEGATIVE
NEISSERIA GONORRHEA: NEGATIVE

## 2016-09-02 LAB — CULTURE, BETA STREP (GROUP B ONLY)

## 2016-09-04 NOTE — Telephone Encounter (Signed)
mychart message to patient. 

## 2016-09-07 ENCOUNTER — Ambulatory Visit (HOSPITAL_COMMUNITY)
Admission: RE | Admit: 2016-09-07 | Discharge: 2016-09-07 | Disposition: A | Discharge status: Home / Self Care | Payer: BLUE CROSS/BLUE SHIELD | Source: Ambulatory Visit | Attending: Obstetrics & Gynecology | Admitting: Obstetrics & Gynecology

## 2016-09-07 ENCOUNTER — Other Ambulatory Visit: Payer: Self-pay | Admitting: Obstetrics & Gynecology

## 2016-09-07 ENCOUNTER — Ambulatory Visit (HOSPITAL_COMMUNITY): Payer: BLUE CROSS/BLUE SHIELD

## 2016-09-07 ENCOUNTER — Ambulatory Visit (INDEPENDENT_AMBULATORY_CARE_PROVIDER_SITE_OTHER): Payer: BLUE CROSS/BLUE SHIELD | Admitting: Obstetrics and Gynecology

## 2016-09-07 VITALS — BP 133/83 | HR 75 | Wt 208.6 lb

## 2016-09-07 DIAGNOSIS — O26899 Other specified pregnancy related conditions, unspecified trimester: Secondary | ICD-10-CM

## 2016-09-07 DIAGNOSIS — IMO0002 Reserved for concepts with insufficient information to code with codable children: Secondary | ICD-10-CM

## 2016-09-07 DIAGNOSIS — O365930 Maternal care for other known or suspected poor fetal growth, third trimester, not applicable or unspecified: Secondary | ICD-10-CM

## 2016-09-07 DIAGNOSIS — Z3A37 37 weeks gestation of pregnancy: Secondary | ICD-10-CM

## 2016-09-07 DIAGNOSIS — Z3483 Encounter for supervision of other normal pregnancy, third trimester: Secondary | ICD-10-CM

## 2016-09-07 DIAGNOSIS — O26843 Uterine size-date discrepancy, third trimester: Secondary | ICD-10-CM | POA: Diagnosis not present

## 2016-09-07 DIAGNOSIS — Z362 Encounter for other antenatal screening follow-up: Secondary | ICD-10-CM | POA: Insufficient documentation

## 2016-09-07 DIAGNOSIS — Z0489 Encounter for examination and observation for other specified reasons: Secondary | ICD-10-CM

## 2016-09-07 DIAGNOSIS — O360930 Maternal care for other rhesus isoimmunization, third trimester, not applicable or unspecified: Secondary | ICD-10-CM

## 2016-09-07 DIAGNOSIS — Z6791 Unspecified blood type, Rh negative: Secondary | ICD-10-CM

## 2016-09-07 DIAGNOSIS — O0993 Supervision of high risk pregnancy, unspecified, third trimester: Secondary | ICD-10-CM

## 2016-09-07 DIAGNOSIS — Z3481 Encounter for supervision of other normal pregnancy, first trimester: Secondary | ICD-10-CM

## 2016-09-07 NOTE — Progress Notes (Signed)
   PRENATAL VISIT NOTE  Subjective:  Samantha Hardin is a 25 y.o. G3P0020 at 8454w5d being seen today for ongoing prenatal care.  She is currently monitored for the following issues for this low-risk pregnancy and has Encounter for supervision of other normal pregnancy in first trimester and Rh negative, antepartum on her problem list.  Patient reports vaginal pressure.  Contractions: Not present. Vag. Bleeding: None.  Movement: Present. Denies leaking of fluid.   The following portions of the patient's history were reviewed and updated as appropriate: allergies, current medications, past family history, past medical history, past social history, past surgical history and problem list. Problem list updated.  Objective:   Vitals:   09/07/16 1023  BP: 133/83  Pulse: 75  Weight: 208 lb 9.6 oz (94.6 kg)    Fetal Status: Fetal Heart Rate (bpm): 139 Fundal Height: 40 cm Movement: Present     General:  Alert, oriented and cooperative. Patient is in no acute distress.  Skin: Skin is warm and dry. No rash noted.   Cardiovascular: Normal heart rate noted  Respiratory: Normal respiratory effort, no problems with respiration noted  Abdomen: Soft, gravid, appropriate for gestational age. Pain/Pressure: Present     Pelvic:  Cervical exam performed 1 cm, 50%, posterior   Extremities: Normal range of motion.  Edema: Trace  Mental Status: Normal mood and affect. Normal behavior. Normal judgment and thought content.   Assessment and Plan:  Pregnancy: G3P0020 at 1554w5d  1. Encounter for supervision of other normal pregnancy in first trimester - Review US today, LGA but not macrosomia. Discussed goal of vaginal delivery.   2. Rh negative, antepartum -Rhogam given at 28 weeks.   Term labor symptoms and general obstetric precautions including but not limited to vaginal bleeding, contractions, leaking of fluid and fetal movement were reviewed in detail with the patient. Please refer to After Visit  Summary for other counseling recommendations.  Return in about 1 week (around 09/14/2016).   Duane LopeJennifer I Jadira Nierman, NP

## 2016-09-07 NOTE — Progress Notes (Signed)
Patient reports LUQ pain

## 2016-09-11 ENCOUNTER — Encounter (HOSPITAL_COMMUNITY): Payer: Self-pay | Admitting: *Deleted

## 2016-09-11 ENCOUNTER — Inpatient Hospital Stay (HOSPITAL_COMMUNITY)
Admission: AD | Admit: 2016-09-11 | Discharge: 2016-09-12 | Disposition: A | Discharge status: Home / Self Care | Payer: BLUE CROSS/BLUE SHIELD | Source: Ambulatory Visit | Attending: Obstetrics & Gynecology | Admitting: Obstetrics & Gynecology

## 2016-09-11 DIAGNOSIS — J45909 Unspecified asthma, uncomplicated: Secondary | ICD-10-CM | POA: Insufficient documentation

## 2016-09-11 DIAGNOSIS — Z87891 Personal history of nicotine dependence: Secondary | ICD-10-CM | POA: Insufficient documentation

## 2016-09-11 DIAGNOSIS — G43909 Migraine, unspecified, not intractable, without status migrainosus: Secondary | ICD-10-CM | POA: Insufficient documentation

## 2016-09-11 DIAGNOSIS — Z3A38 38 weeks gestation of pregnancy: Secondary | ICD-10-CM

## 2016-09-11 DIAGNOSIS — O99344 Other mental disorders complicating childbirth: Secondary | ICD-10-CM | POA: Insufficient documentation

## 2016-09-11 DIAGNOSIS — O26893 Other specified pregnancy related conditions, third trimester: Secondary | ICD-10-CM

## 2016-09-11 DIAGNOSIS — Z6791 Unspecified blood type, Rh negative: Secondary | ICD-10-CM

## 2016-09-11 DIAGNOSIS — Z3493 Encounter for supervision of normal pregnancy, unspecified, third trimester: Secondary | ICD-10-CM

## 2016-09-11 DIAGNOSIS — N898 Other specified noninflammatory disorders of vagina: Secondary | ICD-10-CM

## 2016-09-11 DIAGNOSIS — F329 Major depressive disorder, single episode, unspecified: Secondary | ICD-10-CM

## 2016-09-11 DIAGNOSIS — O9952 Diseases of the respiratory system complicating childbirth: Secondary | ICD-10-CM | POA: Insufficient documentation

## 2016-09-11 DIAGNOSIS — Z3481 Encounter for supervision of other normal pregnancy, first trimester: Secondary | ICD-10-CM

## 2016-09-11 DIAGNOSIS — O26899 Other specified pregnancy related conditions, unspecified trimester: Secondary | ICD-10-CM

## 2016-09-11 NOTE — MAU Note (Addendum)
Leaking fld since 1300. Pain in my private area and sharp pains in abd. Have to pee a lot. Having some ctxs.

## 2016-09-11 NOTE — MAU Note (Signed)
Sent urine to the lab

## 2016-09-12 LAB — WET PREP, GENITAL
Clue Cells Wet Prep HPF POC: NONE SEEN
SPERM: NONE SEEN
TRICH WET PREP: NONE SEEN
YEAST WET PREP: NONE SEEN

## 2016-09-12 LAB — POCT FERN TEST: POCT FERN TEST: NEGATIVE

## 2016-09-12 LAB — AMNISURE RUPTURE OF MEMBRANE (ROM) NOT AT ARMC: Amnisure ROM: NEGATIVE

## 2016-09-12 NOTE — Discharge Instructions (Signed)

## 2016-09-12 NOTE — MAU Provider Note (Signed)
History     CSN: [id number]  Arrival date and time: 09/11/16 2222   First Provider Initiated Contact with Patient 09/12/16 0023     No chief complaint on file.  HPI   Samantha Hardin is a [id number] 25-y/o female at 2443w3d who presents for concern for water breaking. She said she started having leaking of fluid at work and has had wetness in her underwear since midday that has continued. She has not had any big gush of fluid. She denies dysuria and has not had sexual intercourse recently. She also has been having pelvic pressure for the last few days but denies contractions. No vaginal bleeding. She feels her baby moving regularly.   Pregnancy complicated by RH neg status.   Past Medical History:  Diagnosis Date  . Anemia   . Asthma   . Depression    never on meds, doing ok  . Headache(784.0)    when on birth control  . Heart murmur   . Migraines     Past Surgical History:  Procedure Laterality Date  . INDUCED ABORTION      Family History  Problem Relation Age of Onset  . Heart disease Maternal Grandfather   . Diabetes Maternal Grandfather   . Hypertension Maternal Grandfather   . Anesthesia problems Neg Hx     Social History  Substance Use Topics  . Smoking status: Former Smoker    Packs/day: 0.25    Types: Cigarettes    Quit date: 10/26/2014  . Smokeless tobacco: Never Used  . Alcohol use No    Allergies:  Allergies  Allergen Reactions  . Latex Swelling and Rash    Prescriptions Prior to Admission  Medication Sig Dispense Refill Last Dose  . acetaminophen (TYLENOL) 500 MG tablet Take 500 mg by mouth every 6 (six) hours as needed for mild pain.   Taking  . albuterol (PROVENTIL HFA;VENTOLIN HFA) 108 (90 Base) MCG/ACT inhaler Inhale 2 puffs into the lungs every 6 (six) hours as needed for wheezing or shortness of breath.   Taking  . cyclobenzaprine (FLEXERIL) 10 MG tablet Take 1 tablet (10 mg total) by mouth 3 (three) times daily as needed for muscle spasms. 20  tablet 0 Taking  . docusate sodium (COLACE) 100 MG capsule Take 1 capsule (100 mg total) by mouth 2 (two) times daily as needed. 30 capsule 2 Taking    ROS: No RUQ pain, no HAs, no vaginal bleeding, no n/v/d Physical Exam   Blood pressure 127/85, pulse 84, temperature 98.5 F (36.9 C), resp. rate 20, height 5\' 6"  (1.676 m), weight 99.8 kg (220 lb), last menstrual period 12/24/2015.  Physical Exam  Constitutional: She is oriented to person, place, and time. She appears well-developed and well-nourished. No distress.  HENT:  Mouth/Throat: Oropharynx is clear and moist.  Eyes: Pupils are equal, round, and reactive to light.  Cardiovascular: Normal rate, regular rhythm and normal heart sounds.   Respiratory: Effort normal and breath sounds normal. No respiratory distress.  GI: Soft. There is no tenderness.  Gravid.  Genitourinary: Vaginal discharge found.  Musculoskeletal: She exhibits edema (Trace).  Neurological: She is alert and oriented to person, place, and time.  Skin: Skin is warm and dry.  Psychiatric: She has a normal mood and affect.    MAU Course  Procedures  MDM  Performed speculum exam to look for pooling. Yellowish fluid collected in speculum.  Ferning test negative. Wet prep obtained due to amount and color of fluid. Showed only WBCs.  Amnisure performed given amount of fluid. Negative.  No regular contractions on monitors. FHTreassuring.   Assessment and Plan  Patient with increased vaginal discharge in late 3rd trimester. Fluid not consistent with amniotic fluid by exam or labs. No fever, abdominal pain, or genitourinary symptoms to suggest infection.   Discharged with return precautions.   Jamelle HaringHillary M Fitzgerald, MD [facility], PGY-2 09/12/2016, 12:26 AM   I have participated in the care of this patient and I agree with the above. Cam HaiSHAW, Jaquin Coy CNM 9:40 AM 09/12/2016

## 2016-09-13 ENCOUNTER — Encounter (HOSPITAL_COMMUNITY): Payer: Self-pay | Admitting: Certified Nurse Midwife

## 2016-09-13 ENCOUNTER — Inpatient Hospital Stay (HOSPITAL_COMMUNITY)
Admission: AD | Admit: 2016-09-13 | Discharge: 2016-09-17 | DRG: 765 | Disposition: A | Discharge status: Home / Self Care | Payer: BLUE CROSS/BLUE SHIELD | Source: Ambulatory Visit | Attending: Family Medicine | Admitting: Family Medicine

## 2016-09-13 DIAGNOSIS — O26899 Other specified pregnancy related conditions, unspecified trimester: Secondary | ICD-10-CM

## 2016-09-13 DIAGNOSIS — O3663X0 Maternal care for excessive fetal growth, third trimester, not applicable or unspecified: Secondary | ICD-10-CM | POA: Diagnosis present

## 2016-09-13 DIAGNOSIS — Z87891 Personal history of nicotine dependence: Secondary | ICD-10-CM

## 2016-09-13 DIAGNOSIS — O9952 Diseases of the respiratory system complicating childbirth: Secondary | ICD-10-CM | POA: Diagnosis present

## 2016-09-13 DIAGNOSIS — O26893 Other specified pregnancy related conditions, third trimester: Secondary | ICD-10-CM | POA: Diagnosis present

## 2016-09-13 DIAGNOSIS — O479 False labor, unspecified: Secondary | ICD-10-CM | POA: Diagnosis present

## 2016-09-13 DIAGNOSIS — Z6791 Unspecified blood type, Rh negative: Secondary | ICD-10-CM

## 2016-09-13 DIAGNOSIS — Z3A38 38 weeks gestation of pregnancy: Secondary | ICD-10-CM

## 2016-09-13 DIAGNOSIS — J45909 Unspecified asthma, uncomplicated: Secondary | ICD-10-CM | POA: Diagnosis present

## 2016-09-13 DIAGNOSIS — D649 Anemia, unspecified: Secondary | ICD-10-CM | POA: Diagnosis not present

## 2016-09-13 DIAGNOSIS — Z833 Family history of diabetes mellitus: Secondary | ICD-10-CM

## 2016-09-13 DIAGNOSIS — Z3481 Encounter for supervision of other normal pregnancy, first trimester: Secondary | ICD-10-CM

## 2016-09-13 DIAGNOSIS — O9081 Anemia of the puerperium: Secondary | ICD-10-CM | POA: Diagnosis not present

## 2016-09-13 DIAGNOSIS — Z8249 Family history of ischemic heart disease and other diseases of the circulatory system: Secondary | ICD-10-CM

## 2016-09-13 MED ORDER — FENTANYL CITRATE (PF) 100 MCG/2ML IJ SOLN
50.0000 ug | Freq: Once | INTRAMUSCULAR | Status: AC
  Administered 2016-09-13: 50 ug via INTRAVENOUS
  Filled 2016-09-13: qty 2

## 2016-09-13 MED ORDER — LACTATED RINGERS IV SOLN
INTRAVENOUS | Status: DC
  Administered 2016-09-13: 23:00:00 via INTRAVENOUS

## 2016-09-13 NOTE — MAU Note (Signed)
Patient presents to MAU with contractions. States been having contractions since yesterday but became more intense today around 2000. Denies bleeding or leaking fluid.

## 2016-09-14 ENCOUNTER — Encounter (HOSPITAL_COMMUNITY)
Admission: AD | Disposition: A | Discharge status: Home / Self Care | Payer: Self-pay | Source: Ambulatory Visit | Attending: Family Medicine

## 2016-09-14 ENCOUNTER — Encounter: Payer: BLUE CROSS/BLUE SHIELD | Admitting: Obstetrics and Gynecology

## 2016-09-14 ENCOUNTER — Inpatient Hospital Stay (HOSPITAL_COMMUNITY): Payer: BLUE CROSS/BLUE SHIELD | Admitting: Anesthesiology

## 2016-09-14 ENCOUNTER — Encounter (HOSPITAL_COMMUNITY): Payer: Self-pay | Admitting: Anesthesiology

## 2016-09-14 DIAGNOSIS — O9081 Anemia of the puerperium: Secondary | ICD-10-CM | POA: Diagnosis not present

## 2016-09-14 DIAGNOSIS — Z3493 Encounter for supervision of normal pregnancy, unspecified, third trimester: Secondary | ICD-10-CM | POA: Diagnosis present

## 2016-09-14 DIAGNOSIS — Z833 Family history of diabetes mellitus: Secondary | ICD-10-CM | POA: Diagnosis not present

## 2016-09-14 DIAGNOSIS — O9952 Diseases of the respiratory system complicating childbirth: Secondary | ICD-10-CM | POA: Diagnosis present

## 2016-09-14 DIAGNOSIS — O3663X0 Maternal care for excessive fetal growth, third trimester, not applicable or unspecified: Secondary | ICD-10-CM | POA: Diagnosis present

## 2016-09-14 DIAGNOSIS — Z87891 Personal history of nicotine dependence: Secondary | ICD-10-CM | POA: Diagnosis not present

## 2016-09-14 DIAGNOSIS — Z8249 Family history of ischemic heart disease and other diseases of the circulatory system: Secondary | ICD-10-CM | POA: Diagnosis not present

## 2016-09-14 DIAGNOSIS — D649 Anemia, unspecified: Secondary | ICD-10-CM | POA: Diagnosis not present

## 2016-09-14 DIAGNOSIS — O26893 Other specified pregnancy related conditions, third trimester: Secondary | ICD-10-CM | POA: Diagnosis present

## 2016-09-14 DIAGNOSIS — O479 False labor, unspecified: Secondary | ICD-10-CM | POA: Diagnosis present

## 2016-09-14 DIAGNOSIS — J45909 Unspecified asthma, uncomplicated: Secondary | ICD-10-CM | POA: Diagnosis present

## 2016-09-14 DIAGNOSIS — Z6791 Unspecified blood type, Rh negative: Secondary | ICD-10-CM | POA: Diagnosis not present

## 2016-09-14 DIAGNOSIS — Z3A38 38 weeks gestation of pregnancy: Secondary | ICD-10-CM | POA: Diagnosis not present

## 2016-09-14 HISTORY — PX: PERINEAL LACERATION REPAIR: SHX5389

## 2016-09-14 LAB — CBC
HCT: 31.7 % — ABNORMAL LOW (ref 36.0–46.0)
HCT: 35.3 % — ABNORMAL LOW (ref 36.0–46.0)
HCT: 35.5 % — ABNORMAL LOW (ref 36.0–46.0)
HEMOGLOBIN: 11.9 g/dL — AB (ref 12.0–15.0)
Hemoglobin: 10.6 g/dL — ABNORMAL LOW (ref 12.0–15.0)
Hemoglobin: 11.8 g/dL — ABNORMAL LOW (ref 12.0–15.0)
MCH: 28.9 pg (ref 26.0–34.0)
MCH: 29 pg (ref 26.0–34.0)
MCH: 28.5 pg (ref 26.0–34.0)
MCHC: 33.4 g/dL (ref 30.0–36.0)
MCHC: 33.7 g/dL (ref 30.0–36.0)
MCHC: 33.2 g/dL (ref 30.0–36.0)
MCV: 86.4 fL (ref 78.0–100.0)
MCV: 85.9 fL (ref 78.0–100.0)
MCV: 85.7 fL (ref 78.0–100.0)
PLATELETS: 199 10*3/uL (ref 150–400)
PLATELETS: 175 10*3/uL (ref 150–400)
Platelets: 173 10*3/uL (ref 150–400)
RBC: 3.67 MIL/uL — AB (ref 3.87–5.11)
RBC: 4.11 MIL/uL (ref 3.87–5.11)
RBC: 4.14 MIL/uL (ref 3.87–5.11)
RDW: 13.7 % (ref 11.5–15.5)
RDW: 13.7 % (ref 11.5–15.5)
RDW: 13.7 % (ref 11.5–15.5)
WBC: 9.2 10*3/uL (ref 4.0–10.5)
WBC: 16.9 10*3/uL — ABNORMAL HIGH (ref 4.0–10.5)
WBC: 18.5 10*3/uL — AB (ref 4.0–10.5)

## 2016-09-14 LAB — RPR: RPR Ser Ql: NONREACTIVE

## 2016-09-14 SURGERY — Surgical Case
Anesthesia: Epidural | Site: Vagina | Wound class: Clean Contaminated

## 2016-09-14 MED ORDER — SIMETHICONE 80 MG PO CHEW
80.0000 mg | CHEWABLE_TABLET | ORAL | Status: DC
  Administered 2016-09-14 – 2016-09-16 (×3): 80 mg via ORAL
  Filled 2016-09-14 (×3): qty 1

## 2016-09-14 MED ORDER — TETANUS-DIPHTH-ACELL PERTUSSIS 5-2.5-18.5 LF-MCG/0.5 IM SUSP: 0.5000 mL | Freq: Once | INTRAMUSCULAR | Status: DC

## 2016-09-14 MED ORDER — MEPERIDINE HCL 25 MG/ML IJ SOLN: 6.2500 mg | INTRAMUSCULAR | Status: DC | PRN

## 2016-09-14 MED ORDER — MISOPROSTOL 25 MCG QUARTER TABLET
ORAL_TABLET | ORAL | Status: DC | PRN
  Administered 2016-09-14: 1000 ug via RECTAL

## 2016-09-14 MED ORDER — DIPHENHYDRAMINE HCL 50 MG/ML IJ SOLN: 12.5000 mg | INTRAMUSCULAR | Status: DC | PRN

## 2016-09-14 MED ORDER — KETOROLAC TROMETHAMINE 30 MG/ML IJ SOLN: 30.0000 mg | Freq: Once | INTRAMUSCULAR | Status: DC

## 2016-09-14 MED ORDER — ONDANSETRON HCL 4 MG/2ML IJ SOLN
INTRAMUSCULAR | Status: AC
  Filled 2016-09-14: qty 2

## 2016-09-14 MED ORDER — SODIUM CHLORIDE 0.9 % IV SOLN
INTRAVENOUS | Status: DC | PRN
  Administered 2016-09-14: 13:00:00 via INTRAVENOUS

## 2016-09-14 MED ORDER — NALBUPHINE HCL 10 MG/ML IJ SOLN: 5.0000 mg | Freq: Once | INTRAMUSCULAR | Status: DC | PRN

## 2016-09-14 MED ORDER — EPHEDRINE 5 MG/ML INJ: 10.0000 mg | INTRAVENOUS | Status: DC | PRN

## 2016-09-14 MED ORDER — DIPHENHYDRAMINE HCL 25 MG PO CAPS
25.0000 mg | ORAL_CAPSULE | ORAL | Status: DC | PRN
Start: 2016-09-14 — End: 2016-09-17
  Administered 2016-09-14 – 2016-09-15 (×2): 25 mg via ORAL
  Filled 2016-09-14 (×2): qty 1

## 2016-09-14 MED ORDER — PROMETHAZINE HCL 25 MG/ML IJ SOLN: 6.2500 mg | INTRAMUSCULAR | Status: DC | PRN

## 2016-09-14 MED ORDER — LACTATED RINGERS IV SOLN
INTRAVENOUS | Status: DC
  Administered 2016-09-14: 08:00:00 via INTRAVENOUS

## 2016-09-14 MED ORDER — IBUPROFEN 600 MG PO TABS
600.0000 mg | ORAL_TABLET | Freq: Four times a day (QID) | ORAL | Status: DC
  Administered 2016-09-14 – 2016-09-17 (×11): 600 mg via ORAL
  Filled 2016-09-14 (×11): qty 1

## 2016-09-14 MED ORDER — SODIUM CHLORIDE 0.9 % IR SOLN
Status: DC | PRN
  Administered 2016-09-14: 1000 mL

## 2016-09-14 MED ORDER — NALOXONE HCL 2 MG/2ML IJ SOSY: 1.0000 ug/kg/h | PREFILLED_SYRINGE | INTRAVENOUS | Status: DC | PRN

## 2016-09-14 MED ORDER — DIBUCAINE 1 % RE OINT: 1.0000 "application " | TOPICAL_OINTMENT | RECTAL | Status: DC | PRN

## 2016-09-14 MED ORDER — SOD CITRATE-CITRIC ACID 500-334 MG/5ML PO SOLN
30.0000 mL | ORAL | Status: DC | PRN
  Filled 2016-09-14: qty 15

## 2016-09-14 MED ORDER — FLEET ENEMA 7-19 GM/118ML RE ENEM: 1.0000 | ENEMA | Freq: Once | RECTAL | Status: DC

## 2016-09-14 MED ORDER — MISOPROSTOL 200 MCG PO TABS
ORAL_TABLET | ORAL | Status: AC
  Filled 2016-09-14: qty 5

## 2016-09-14 MED ORDER — KETOROLAC TROMETHAMINE 30 MG/ML IJ SOLN: 30.0000 mg | Freq: Four times a day (QID) | INTRAMUSCULAR | Status: DC | PRN

## 2016-09-14 MED ORDER — SODIUM BICARBONATE 8.4 % IV SOLN
INTRAVENOUS | Status: AC
  Filled 2016-09-14: qty 50

## 2016-09-14 MED ORDER — LACTATED RINGERS IV SOLN
INTRAVENOUS | Status: DC | PRN
  Administered 2016-09-14 (×2): via INTRAVENOUS

## 2016-09-14 MED ORDER — ACETAMINOPHEN 325 MG PO TABS: 650.0000 mg | ORAL_TABLET | ORAL | Status: DC | PRN

## 2016-09-14 MED ORDER — ACETAMINOPHEN 500 MG PO TABS
1000.0000 mg | ORAL_TABLET | Freq: Four times a day (QID) | ORAL | Status: AC
  Administered 2016-09-14 – 2016-09-15 (×4): 1000 mg via ORAL
  Filled 2016-09-14 (×4): qty 2

## 2016-09-14 MED ORDER — WITCH HAZEL-GLYCERIN EX PADS: 1.0000 "application " | MEDICATED_PAD | CUTANEOUS | Status: DC | PRN

## 2016-09-14 MED ORDER — SIMETHICONE 80 MG PO CHEW
80.0000 mg | CHEWABLE_TABLET | Freq: Three times a day (TID) | ORAL | Status: DC
  Administered 2016-09-14 – 2016-09-17 (×7): 80 mg via ORAL
  Filled 2016-09-14 (×8): qty 1

## 2016-09-14 MED ORDER — LACTATED RINGERS IV SOLN: 500.0000 mL | INTRAVENOUS | Status: DC | PRN

## 2016-09-14 MED ORDER — MORPHINE SULFATE (PF) 0.5 MG/ML IJ SOLN
INTRAMUSCULAR | Status: AC
  Filled 2016-09-14: qty 10

## 2016-09-14 MED ORDER — MORPHINE SULFATE (PF) 0.5 MG/ML IJ SOLN
INTRAMUSCULAR | Status: DC | PRN
  Administered 2016-09-14: 2 mg via INTRAVENOUS
  Administered 2016-09-14: 3 mg via EPIDURAL

## 2016-09-14 MED ORDER — LIDOCAINE-EPINEPHRINE (PF) 2 %-1:200000 IJ SOLN
INTRAMUSCULAR | Status: AC
  Filled 2016-09-14: qty 20

## 2016-09-14 MED ORDER — OXYTOCIN BOLUS FROM INFUSION: 500.0000 mL | Freq: Once | INTRAVENOUS | Status: DC

## 2016-09-14 MED ORDER — OXYTOCIN 40 UNITS IN LACTATED RINGERS INFUSION - SIMPLE MED: 2.5000 [IU]/h | INTRAVENOUS | Status: DC

## 2016-09-14 MED ORDER — ONDANSETRON HCL 4 MG/2ML IJ SOLN: 4.0000 mg | Freq: Four times a day (QID) | INTRAMUSCULAR | Status: DC | PRN

## 2016-09-14 MED ORDER — NALBUPHINE HCL 10 MG/ML IJ SOLN
5.0000 mg | INTRAMUSCULAR | Status: DC | PRN
  Administered 2016-09-15: 5 mg via INTRAVENOUS

## 2016-09-14 MED ORDER — CLINDAMYCIN PHOSPHATE 900 MG/50ML IV SOLN
900.0000 mg | Freq: Three times a day (TID) | INTRAVENOUS | Status: DC
  Filled 2016-09-14 (×2): qty 50

## 2016-09-14 MED ORDER — LACTATED RINGERS IV SOLN
500.0000 mL | Freq: Once | INTRAVENOUS | Status: AC
  Administered 2016-09-14: 500 mL via INTRAVENOUS

## 2016-09-14 MED ORDER — SODIUM BICARBONATE 8.4 % IV SOLN
INTRAVENOUS | Status: DC | PRN
  Administered 2016-09-14: 20 mL via EPIDURAL

## 2016-09-14 MED ORDER — OXYTOCIN 40 UNITS IN LACTATED RINGERS INFUSION - SIMPLE MED: 2.5000 [IU]/h | INTRAVENOUS | Status: AC

## 2016-09-14 MED ORDER — HYDROMORPHONE HCL 1 MG/ML IJ SOLN
INTRAMUSCULAR | Status: AC
  Administered 2016-09-14: 0.5 mg via INTRAVENOUS
  Filled 2016-09-14: qty 1

## 2016-09-14 MED ORDER — OXYCODONE-ACETAMINOPHEN 5-325 MG PO TABS: 2.0000 | ORAL_TABLET | ORAL | Status: DC | PRN

## 2016-09-14 MED ORDER — MEPERIDINE HCL 25 MG/ML IJ SOLN
INTRAMUSCULAR | Status: DC | PRN
  Administered 2016-09-14: 12.5 mg via INTRAVENOUS

## 2016-09-14 MED ORDER — SIMETHICONE 80 MG PO CHEW: 80.0000 mg | CHEWABLE_TABLET | ORAL | Status: DC | PRN

## 2016-09-14 MED ORDER — OXYTOCIN 10 UNIT/ML IJ SOLN
INTRAMUSCULAR | Status: DC | PRN
  Administered 2016-09-14 (×2): 40 [IU] via INTRAVENOUS

## 2016-09-14 MED ORDER — FENTANYL 2.5 MCG/ML BUPIVACAINE 1/10 % EPIDURAL INFUSION (WH - ANES)
14.0000 mL/h | INTRAMUSCULAR | Status: DC | PRN
  Administered 2016-09-14: 14 mL/h via EPIDURAL
  Filled 2016-09-14: qty 100

## 2016-09-14 MED ORDER — IBUPROFEN 600 MG PO TABS: 600.0000 mg | ORAL_TABLET | Freq: Four times a day (QID) | ORAL | Status: DC | PRN

## 2016-09-14 MED ORDER — DIPHENHYDRAMINE HCL 25 MG PO CAPS
25.0000 mg | ORAL_CAPSULE | Freq: Four times a day (QID) | ORAL | Status: DC | PRN
  Administered 2016-09-15: 25 mg via ORAL
  Filled 2016-09-14: qty 1

## 2016-09-14 MED ORDER — OXYCODONE HCL 5 MG PO TABS: 10.0000 mg | ORAL_TABLET | ORAL | Status: DC | PRN

## 2016-09-14 MED ORDER — PHENYLEPHRINE 40 MCG/ML (10ML) SYRINGE FOR IV PUSH (FOR BLOOD PRESSURE SUPPORT): 80.0000 ug | PREFILLED_SYRINGE | INTRAVENOUS | Status: DC | PRN

## 2016-09-14 MED ORDER — PHENYLEPHRINE 40 MCG/ML (10ML) SYRINGE FOR IV PUSH (FOR BLOOD PRESSURE SUPPORT)
80.0000 ug | PREFILLED_SYRINGE | INTRAVENOUS | Status: DC | PRN
  Filled 2016-09-14: qty 10

## 2016-09-14 MED ORDER — FENTANYL CITRATE (PF) 100 MCG/2ML IJ SOLN
50.0000 ug | INTRAMUSCULAR | Status: DC | PRN
  Administered 2016-09-14 (×2): 100 ug via INTRAVENOUS
  Filled 2016-09-14 (×2): qty 2

## 2016-09-14 MED ORDER — METHYLERGONOVINE MALEATE 0.2 MG/ML IJ SOLN
INTRAMUSCULAR | Status: DC | PRN
  Administered 2016-09-14 (×2): 0.2 mg via INTRAMUSCULAR

## 2016-09-14 MED ORDER — SCOPOLAMINE 1 MG/3DAYS TD PT72
MEDICATED_PATCH | TRANSDERMAL | Status: DC | PRN
  Administered 2016-09-14: 1 via TRANSDERMAL

## 2016-09-14 MED ORDER — PRENATAL MULTIVITAMIN CH
1.0000 | ORAL_TABLET | Freq: Every day | ORAL | Status: DC
  Administered 2016-09-15 – 2016-09-17 (×3): 1 via ORAL
  Filled 2016-09-14 (×3): qty 1

## 2016-09-14 MED ORDER — NALOXONE HCL 0.4 MG/ML IJ SOLN: 0.4000 mg | INTRAMUSCULAR | Status: DC | PRN

## 2016-09-14 MED ORDER — CEFAZOLIN SODIUM-DEXTROSE 2-3 GM-% IV SOLR
INTRAVENOUS | Status: DC | PRN
  Administered 2016-09-14: 2 g via INTRAVENOUS

## 2016-09-14 MED ORDER — ONDANSETRON HCL 4 MG/2ML IJ SOLN
INTRAMUSCULAR | Status: DC | PRN
  Administered 2016-09-14: 4 mg via INTRAVENOUS

## 2016-09-14 MED ORDER — LACTATED RINGERS IV SOLN
INTRAVENOUS | Status: DC
  Administered 2016-09-14 – 2016-09-15 (×3): via INTRAVENOUS

## 2016-09-14 MED ORDER — OXYCODONE-ACETAMINOPHEN 5-325 MG PO TABS: 1.0000 | ORAL_TABLET | ORAL | Status: DC | PRN

## 2016-09-14 MED ORDER — ONDANSETRON HCL 4 MG/2ML IJ SOLN: 4.0000 mg | Freq: Three times a day (TID) | INTRAMUSCULAR | Status: DC | PRN

## 2016-09-14 MED ORDER — SENNOSIDES-DOCUSATE SODIUM 8.6-50 MG PO TABS
2.0000 | ORAL_TABLET | ORAL | Status: DC
  Administered 2016-09-14 – 2016-09-16 (×3): 2 via ORAL
  Filled 2016-09-14 (×3): qty 2

## 2016-09-14 MED ORDER — LIDOCAINE HCL (PF) 1 % IJ SOLN: 30.0000 mL | INTRAMUSCULAR | Status: DC | PRN

## 2016-09-14 MED ORDER — NALBUPHINE HCL 10 MG/ML IJ SOLN
5.0000 mg | Freq: Once | INTRAMUSCULAR | Status: DC | PRN
  Filled 2016-09-14: qty 1

## 2016-09-14 MED ORDER — HYDROMORPHONE HCL 1 MG/ML IJ SOLN
0.2500 mg | INTRAMUSCULAR | Status: DC | PRN
  Administered 2016-09-14 (×2): 0.5 mg via INTRAVENOUS

## 2016-09-14 MED ORDER — OXYTOCIN 10 UNIT/ML IJ SOLN
INTRAMUSCULAR | Status: AC
  Filled 2016-09-14: qty 4

## 2016-09-14 MED ORDER — LIDOCAINE HCL (PF) 1 % IJ SOLN
INTRAMUSCULAR | Status: DC | PRN
  Administered 2016-09-14: 7 mL via EPIDURAL
  Administered 2016-09-14: 6 mL via EPIDURAL

## 2016-09-14 MED ORDER — SCOPOLAMINE 1 MG/3DAYS TD PT72: 1.0000 | MEDICATED_PATCH | Freq: Once | TRANSDERMAL | Status: DC

## 2016-09-14 MED ORDER — COCONUT OIL OIL: 1.0000 "application " | TOPICAL_OIL | Status: DC | PRN

## 2016-09-14 MED ORDER — DEXAMETHASONE SODIUM PHOSPHATE 10 MG/ML IJ SOLN
INTRAMUSCULAR | Status: DC | PRN
  Administered 2016-09-14: 10 mg via INTRAVENOUS

## 2016-09-14 MED ORDER — MENTHOL 3 MG MT LOZG: 1.0000 | LOZENGE | OROMUCOSAL | Status: DC | PRN

## 2016-09-14 MED ORDER — LIDOCAINE-EPINEPHRINE (PF) 2 %-1:200000 IJ SOLN
INTRAMUSCULAR | Status: DC | PRN
  Administered 2016-09-14 (×5): 5 mL via INTRADERMAL

## 2016-09-14 MED ORDER — MEPERIDINE HCL 25 MG/ML IJ SOLN
INTRAMUSCULAR | Status: AC
  Filled 2016-09-14: qty 1

## 2016-09-14 MED ORDER — SODIUM CHLORIDE 0.9% FLUSH
3.0000 mL | INTRAVENOUS | Status: DC | PRN
  Administered 2016-09-14: 3 mL via INTRAVENOUS
  Filled 2016-09-14: qty 3

## 2016-09-14 MED ORDER — OXYCODONE HCL 5 MG PO TABS: 5.0000 mg | ORAL_TABLET | ORAL | Status: DC | PRN

## 2016-09-14 MED ORDER — GENTAMICIN SULFATE 40 MG/ML IJ SOLN
Freq: Three times a day (TID) | INTRAVENOUS | Status: DC
  Administered 2016-09-14 – 2016-09-15 (×4): via INTRAVENOUS
  Filled 2016-09-14 (×5): qty 4.5

## 2016-09-14 MED ORDER — NALBUPHINE HCL 10 MG/ML IJ SOLN: 5.0000 mg | INTRAMUSCULAR | Status: DC | PRN

## 2016-09-14 MED ORDER — BUPIVACAINE HCL (PF) 0.25 % IJ SOLN: INTRAMUSCULAR | Status: DC | PRN

## 2016-09-14 MED ORDER — ZOLPIDEM TARTRATE 5 MG PO TABS: 5.0000 mg | ORAL_TABLET | Freq: Every evening | ORAL | Status: DC | PRN

## 2016-09-14 SURGICAL SUPPLY — 38 items
APL SKNCLS STERI-STRIP NONHPOA (GAUZE/BANDAGES/DRESSINGS) ×1
BALLN POSTPARTUM SOS BAKRI (BALLOONS) ×4
BALLOON POSTPARTUM SOS BAKRI (BALLOONS) ×2 IMPLANT
BENZOIN TINCTURE PRP APPL 2/3 (GAUZE/BANDAGES/DRESSINGS) ×4 IMPLANT
CHLORAPREP W/TINT 26ML (MISCELLANEOUS) ×4 IMPLANT
CLAMP CORD UMBIL (MISCELLANEOUS) ×4 IMPLANT
CLOSURE WOUND 1/2 X4 (GAUZE/BANDAGES/DRESSINGS) ×1
CLOTH BEACON ORANGE TIMEOUT ST (SAFETY) ×4 IMPLANT
DRSG OPSITE POSTOP 4X10 (GAUZE/BANDAGES/DRESSINGS) ×4 IMPLANT
ELECT REM PT RETURN 9FT ADLT (ELECTROSURGICAL) ×4
ELECTRODE REM PT RTRN 9FT ADLT (ELECTROSURGICAL) ×2 IMPLANT
GLOVE BIOGEL PI IND STRL 7.0 (GLOVE) ×4 IMPLANT
GLOVE BIOGEL PI IND STRL 7.5 (GLOVE) ×4 IMPLANT
GLOVE BIOGEL PI INDICATOR 7.0 (GLOVE) ×4
GLOVE BIOGEL PI INDICATOR 7.5 (GLOVE) ×4
GLOVE SURG SS PI 7.0 STRL IVOR (GLOVE) ×32 IMPLANT
GLOVE SURG SS PI 7.5 STRL IVOR (GLOVE) ×8 IMPLANT
GOWN STRL REUS W/TWL LRG LVL3 (GOWN DISPOSABLE) ×12 IMPLANT
HEMOSTAT ARISTA ABSORB 3G PWDR (MISCELLANEOUS) ×4 IMPLANT
KIT ABG SYR 3ML LUER SLIP (SYRINGE) ×4 IMPLANT
NEEDLE HYPO 25X5/8 SAFETYGLIDE (NEEDLE) ×4 IMPLANT
NS IRRIG 1000ML POUR BTL (IV SOLUTION) ×4 IMPLANT
PACK C SECTION WH (CUSTOM PROCEDURE TRAY) ×4 IMPLANT
PAD ABD 7.5X8 STRL (GAUZE/BANDAGES/DRESSINGS) ×4 IMPLANT
PAD OB MATERNITY 4.3X12.25 (PERSONAL CARE ITEMS) ×4 IMPLANT
PENCIL SMOKE EVAC W/HOLSTER (ELECTROSURGICAL) ×4 IMPLANT
PLUG CATH AND CAP STER (CATHETERS) ×4 IMPLANT
SPONGE GAUZE 4X4 12PLY (GAUZE/BANDAGES/DRESSINGS) ×4 IMPLANT
SPONGE LAP 18X18 X RAY DECT (DISPOSABLE) ×8 IMPLANT
STRIP CLOSURE SKIN 1/2X4 (GAUZE/BANDAGES/DRESSINGS) ×3 IMPLANT
SUT VIC AB 0 CTX 36 (SUTURE) ×12
SUT VIC AB 0 CTX36XBRD ANBCTRL (SUTURE) ×8 IMPLANT
SUT VIC AB 2-0 CT1 27 (SUTURE) ×4
SUT VIC AB 2-0 CT1 TAPERPNT 27 (SUTURE) ×4 IMPLANT
SUT VIC AB 4-0 KS 27 (SUTURE) ×4 IMPLANT
SYR 50ML LL SCALE MARK (SYRINGE) ×20 IMPLANT
TAPE CLOTH SURG 4X10 WHT LF (GAUZE/BANDAGES/DRESSINGS) ×4 IMPLANT
TOWEL OR 17X24 6PK STRL BLUE (TOWEL DISPOSABLE) ×4 IMPLANT

## 2016-09-14 NOTE — Addendum Note (Signed)
Addendum  created 09/14/16 2237 by Skyra Crichlow O Redina Zeller, CRNA   Sign clinical note    

## 2016-09-14 NOTE — Progress Notes (Signed)
OB Interim Progress Note  S: Patient comfortable after epidural.  Discussed augmentation of labor by performing AROM with patient.  She was agreeable.   O: BP 131/83   Pulse (!) 104   Temp 98.7 F (37.1 C) (Oral) Comment: recheck  Resp 16   Ht 5\' 6"  (1.676 m)   Wt 94.3 kg (208 lb)   LMP 12/24/2015 (Approximate)   SpO2 99%   BMI 33.57 kg/m    Dilation: 4.5 Effacement (%): 100 Cervical Position: Middle Station: -1 Presentation: Vertex Exam by:: mumaw md  AROM performed, clear fluid returned.  Patient tolerated procedure well.    A/P: AROM performed.  Continue expectant management.  May consider ptocin if no further changes.  Anticipate vaginal delivery.     Freddrick MarchYashika Quaniya Damas, MD 09/14/2016, 9:58 AM PGY-1, [facility]

## 2016-09-14 NOTE — Anesthesia Procedure Notes (Signed)
Epidural Patient location during procedure: OB Start time: 09/14/2016 8:57 AM End time: 09/14/2016 9:01 AM  Staffing Anesthesiologist: Leilani AbleHATCHETT, Codee Tutson Performed: anesthesiologist   Preanesthetic Checklist Completed: patient identified, surgical consent, pre-op evaluation, timeout performed, IV checked, risks and benefits discussed and monitors and equipment checked  Epidural Patient position: sitting Prep: site prepped and draped and DuraPrep Patient monitoring: continuous pulse ox and blood pressure Approach: midline Location: L3-L4 Injection technique: LOR air  Needle:  Needle type: Tuohy  Needle gauge: 17 G Needle length: 9 cm and 9 Needle insertion depth: 5 cm cm Catheter type: closed end flexible Catheter size: 19 Gauge Catheter at skin depth: 10 cm Test dose: negative and Other  Assessment Sensory level: T9 Events: blood not aspirated, injection not painful, no injection resistance, negative IV test and no paresthesia  Additional Notes Reason for block:procedure for pain

## 2016-09-14 NOTE — Anesthesia Postprocedure Evaluation (Signed)
Anesthesia Post Note  Patient: Samantha Hardin  Procedure(s) Performed: Procedure(s) (LRB): CESAREAN SECTION (N/A) BAKRI BALLOON PLACEMENT (N/A)  Patient location during evaluation: Mother Baby Anesthesia Type: Epidural Level of consciousness: awake and alert Pain management: satisfactory to patient Vital Signs Assessment: post-procedure vital signs reviewed and stable Respiratory status: respiratory function stable Cardiovascular status: stable Postop Assessment: no headache, no backache, epidural receding, patient able to bend at knees, no signs of nausea or vomiting and adequate PO intake Anesthetic complications: no     Last Vitals:  Vitals:   09/14/16 1813 09/14/16 2152  BP: (!) 145/84 131/79  Pulse: 100 86  Resp: 18 18  Temp: 37 C 37.2 C    Last Pain:  Vitals:   09/14/16 2152  TempSrc: Oral  PainSc:    Pain Goal: Patients Stated Pain Goal: 2 (09/14/16 1622)               Colson Barco

## 2016-09-14 NOTE — H&P (Signed)
LABOR ADMISSION HISTORY AND PHYSICAL  Samantha Hardin is a 25 y.o. female G3P0020 with IUP at 3756w5d by 8w U/S presenting for SOL. She reports +FM, + contractions, No LOF (though presented to MAU with concern for this last night), no VB, no blurry vision, headaches or peripheral edema, and RUQ pain.  She plans on breast feeding. She requests depo for birth control.  Dating: By early 8w U/S --->  Estimated Date of Delivery: 09/23/16  Sono:    @[redacted]w[redacted]d , CWD, normal anatomy but limited views of heart and spine, transverse lie, 308g, 54% EFW @[redacted]w[redacted]d , inconsistent with dating - read as 475w4d, normal cardiac anatomy, spine not well visualized, cephalic presentation, 3500 g, 86% EFW  Prenatal History/Complications: RH negative LGA  Past Medical History: Past Medical History:  Diagnosis Date  . Anemia   . Asthma   . Depression    never on meds, doing ok  . Headache(784.0)    when on birth control  . Heart murmur   . Migraines     Past Surgical History: Past Surgical History:  Procedure Laterality Date  . INDUCED ABORTION      Obstetrical History: OB History    Gravida Para Term Preterm AB Living   3 0 0 0 2 0   SAB TAB Ectopic Multiple Live Births   0 2 0 0        Social History: Social History   Social History  . Marital status: Single    Spouse name: N/A  . Number of children: N/A  . Years of education: N/A   Social History Main Topics  . Smoking status: Former Smoker    Packs/day: 0.25    Types: Cigarettes    Quit date: 10/26/2014  . Smokeless tobacco: Never Used  . Alcohol use No  . Drug use: No  . Sexual activity: Yes    Birth control/ protection: None   Other Topics Concern  . None   Social History Narrative  . None    Family History: Family History  Problem Relation Age of Onset  . Heart disease Maternal Grandfather   . Diabetes Maternal Grandfather   . Hypertension Maternal Grandfather   . Anesthesia problems Neg Hx     Allergies: Allergies   Allergen Reactions  . Latex Swelling and Rash    Prescriptions Prior to Admission  Medication Sig Dispense Refill Last Dose  . cyclobenzaprine (FLEXERIL) 10 MG tablet Take 1 tablet (10 mg total) by mouth 3 (three) times daily as needed for muscle spasms. 20 tablet 0 09/13/2016 at Unknown time  . acetaminophen (TYLENOL) 500 MG tablet Take 500 mg by mouth every 6 (six) hours as needed for mild pain.   Taking  . albuterol (PROVENTIL HFA;VENTOLIN HFA) 108 (90 Base) MCG/ACT inhaler Inhale 2 puffs into the lungs every 6 (six) hours as needed for wheezing or shortness of breath.   Taking  . docusate sodium (COLACE) 100 MG capsule Take 1 capsule (100 mg total) by mouth 2 (two) times daily as needed. 30 capsule 2 Taking     Review of Systems   All systems reviewed and negative except as stated in HPI  BP 149/68   Pulse 77   LMP 12/24/2015 (Approximate)  General appearance: alert and cooperative Lungs: clear to auscultation bilaterally Heart: regular rate and rhythm Abdomen: soft, non-tender; bowel sounds normal Extremities: Homans sign is negative, no sign of DVT, edema Presentation: cephalic -- confirmed with ultrasound as head feels soft Fetal monitoringBaseline: 120 bpm,  Variability: Good {> 6 bpm), Accelerations: Reactive and Decelerations: Absent Uterine activityDate/time of onset: 11/19 and Frequency: Every 2-3 minutes Dilation: 4.5 Effacement (%): 90 Station: -2 Exam by:: Dr Sampson GoonFitzgerald    Prenatal labs: ABO, Rh: AB/NEG/-- (06/12 1002) Antibody: NEG (06/12 1002) Rubella: Immune RPR: NON REAC (08/30 1151)  HBsAg: NEGATIVE (06/12 1002)  HIV: NONREACTIVE (08/30 1151)  GBS: Negative (11/06 0000)  1 hr Glucola 103 Genetic screening  N/A Anatomy US: normal but spine poorly visualized  Prenatal Transfer Tool  Maternal Diabetes: No Genetic Screening: Declined Maternal Ultrasounds/Referrals: Normal but LGA at 86% EFW on 37w U/S Fetal Ultrasounds or other Referrals:   None Maternal Substance Abuse:  No Significant Maternal Medications:  None Significant Maternal Lab Results: Lab values include: Group B Strep negative, Rh negative  No results found for this or any previous visit (from the past 24 hour(s)).  Patient Active Problem List   Diagnosis Date Noted  . Rh negative, antepartum 05/27/2016  . Encounter for supervision of other normal pregnancy in first trimester 02/18/2016    Assessment: Samantha Hardin is a 25 y.o. G3P0020 at 3933w5d here for SOL.   #Labor: Expectant management #Pain: IV pain medication #FWB: Cat I #ID:  GBS negative #MOF: Breast #MOC: Depo #Circ:  Yes (inpatient)  Dani GobbleHillary Fitzgerald, MD [facility], PGY-2

## 2016-09-14 NOTE — Anesthesia Pain Management Evaluation Note (Signed)
  CRNA Pain Management Visit Note  Patient: Samantha Hardin, 25 y.o., female  "Hello I am a [facility]. We have an anesthesia team available at all times to provide care throughout the hospital, including epidural management and anesthesia for C-section. I don't know your plan for the delivery whether it a natural birth, water birth, IV sedation, nitrous supplementation, doula or epidural, but we want to meet your pain goals."   1.Was your pain managed to your expectations on prior hospitalizations?   No prior hospitalizations  2.What is your expectation for pain management during this hospitalization?     Epidural and IV pain meds  3.How can we help you reach that goal? IV pain rx at this time  Record the patient's initial score and the patient's pain goal.   Pain: 6  Pain Goal: 6 [facility] wants you to be able to say your pain was always managed very well.  Samantha Hardin 09/14/2016

## 2016-09-14 NOTE — Progress Notes (Signed)
Pharmacy Antibiotic Note  Samantha Hardin is a 25 y.o. female admitted on 09/13/2016 with SOL .  Pharmacy has been consulted for Gentamicin dosing for endometritis prophylaxis while bakri balloon in place.  Plan: Gentamicin 180mg  IV q8h  Will order SCr for 11/21 am   Height: 5\' 6"  (167.6 cm) Weight: 208 lb (94.3 kg) IBW/kg (Calculated) : 59.3  Temp (24hrs), Avg:99.2 F (37.3 C), Min:98.3 F (36.8 C), Max:100.2 F (37.9 C)   Recent Labs Lab 09/13/16 2327 09/14/16 1523  WBC 9.2 16.9*    CrCl cannot be calculated (Patient's most recent lab result is older than the maximum 21 days allowed.).    Allergies  Allergen Reactions  . Latex Swelling and Rash    Antimicrobials this admission: Cefazolin 2 gram IV x 1   11/20 @ 1236 (pre-op) Clindamycin 900mg  IV q8h  11/20 >>>   Dose adjustments this admission:   Microbiology results:   Thank you for allowing pharmacy to be a part of this patient's care.  Claybon Jabsngel, Rivaan Kendall G 09/14/2016 4:30 PM

## 2016-09-14 NOTE — Anesthesia Preprocedure Evaluation (Addendum)
Anesthesia Evaluation  Patient identified by MRN, date of birth, ID band Patient awake    Reviewed: Allergy & Precautions, H&P , NPO status , Patient's Chart, lab work & pertinent test results  Airway Mallampati: II  TM Distance: >3 FB Neck ROM: full    Dental no notable dental hx.    Pulmonary former smoker,    Pulmonary exam normal        Cardiovascular negative cardio ROS Normal cardiovascular exam     Neuro/Psych    GI/Hepatic negative GI ROS, Neg liver ROS,   Endo/Other  negative endocrine ROS  Renal/GU negative Renal ROS     Musculoskeletal   Abdominal (+) + obese,   Peds  Hematology   Anesthesia Other Findings   Reproductive/Obstetrics (+) Pregnancy                                                              Anesthesia Evaluation  Patient identified by MRN, date of birth, ID band Patient awake    Reviewed: Allergy & Precautions, H&P , NPO status , Patient's Chart, lab work & pertinent test results  Airway Mallampati: II  TM Distance: >3 FB Neck ROM: full    Dental no notable dental hx.    Pulmonary former smoker,    Pulmonary exam normal        Cardiovascular negative cardio ROS Normal cardiovascular exam     Neuro/Psych    GI/Hepatic negative GI ROS, Neg liver ROS,   Endo/Other  negative endocrine ROS  Renal/GU negative Renal ROS     Musculoskeletal   Abdominal (+) + obese,   Peds  Hematology   Anesthesia Other Findings   Reproductive/Obstetrics (+) Pregnancy                             Anesthesia Physical Anesthesia Plan  ASA:   Anesthesia Plan:    Post-op Pain Management:    Induction:   Airway Management Planned:   Additional Equipment:   Intra-op Plan:   Post-operative Plan:   Informed Consent: I have reviewed the patients History and Physical, chart, labs and discussed the procedure  including the risks, benefits and alternatives for the proposed anesthesia with the patient or authorized representative who has indicated his/her understanding and acceptance.     Plan Discussed with:   Anesthesia Plan Comments:         Anesthesia Quick Evaluation                                   Anesthesia Evaluation  Patient identified by MRN, date of birth, ID band Patient awake    Reviewed: Allergy & Precautions, H&P , NPO status , Patient's Chart, lab work & pertinent test results  Airway Mallampati: II  TM Distance: >3 FB Neck ROM: full    Dental no notable dental hx.    Pulmonary former smoker,    Pulmonary exam normal        Cardiovascular negative cardio ROS Normal cardiovascular exam     Neuro/Psych    GI/Hepatic negative GI ROS, Neg liver ROS,   Endo/Other  negative endocrine ROS  Renal/GU negative Renal ROS  Musculoskeletal   Abdominal (+) + obese,   Peds  Hematology   Anesthesia Other Findings   Reproductive/Obstetrics (+) Pregnancy                             Anesthesia Physical Anesthesia Plan  ASA:   Anesthesia Plan:    Post-op Pain Management:    Induction:   Airway Management Planned:   Additional Equipment:   Intra-op Plan:   Post-operative Plan:   Informed Consent: I have reviewed the patients History and Physical, chart, labs and discussed the procedure including the risks, benefits and alternatives for the proposed anesthesia with the patient or authorized representative who has indicated his/her understanding and acceptance.     Plan Discussed with:   Anesthesia Plan Comments:         Anesthesia Quick Evaluation  Anesthesia Physical Anesthesia Plan  ASA: II  Anesthesia Plan: Epidural   Post-op Pain Management:    Induction:   Airway Management Planned:   Additional Equipment:   Intra-op Plan:   Post-operative Plan:   Informed Consent: I have  reviewed the patients History and Physical, chart, labs and discussed the procedure including the risks, benefits and alternatives for the proposed anesthesia with the patient or authorized representative who has indicated his/her understanding and acceptance.     Plan Discussed with: CRNA and Surgeon  Anesthesia Plan Comments: (For C/S with labor epidural already dosed.)      Anesthesia Quick Evaluation

## 2016-09-14 NOTE — Op Note (Signed)
Samantha Hardin PROCEDURE DATE: 09/13/2016 - 09/14/2016  PREOPERATIVE DIAGNOSIS: Intrauterine pregnancy at  5348w5d weeks gestation; non-reassuring fetal status, prolonged bradycardia  POSTOPERATIVE DIAGNOSIS: The same plus postpartum hemorrhage  PROCEDURE: Primary Low Transverse Cesarean Section  SURGEON:  Dr. Candelaria CelesteJacob Stinson  ASSISTANT: Dr Jen MowElizabeth Soua Lenk  INDICATIONS: Samantha HugerLauresa D Hardin is a 25 y.o. G3P0020 at 9748w5d underwent an urgent primary cesarean section secondary to non-reassuring fetal status, prolonged bradycardia.  The risks of cesarean section discussed with the patient included but were not limited to: bleeding which may require transfusion or reoperation; infection which may require antibiotics; injury to bowel, bladder, ureters or other surrounding organs; injury to the fetus; need for additional procedures including hysterectomy in the event of a life-threatening hemorrhage; placental abnormalities wth subsequent pregnancies, incisional problems, thromboembolic phenomenon and other postoperative/anesthesia complications. The patient concurred with the proposed plan, giving informed written consent for the procedure.    FINDINGS:  Viable female infant in cephalic presentation.  Apgars 9 and 9, weight is pending, skin to skin.  Clear amniotic fluid.  Intact placenta, three vessel cord.  Normal uterus, fallopian tubes and ovaries bilaterally. Uterine atony was noted after uterine incision closure.   ANESTHESIA:   Spinal INTRAVENOUS FLUIDS:3400 ml + 2 units PRBC ESTIMATED BLOOD LOSS: 1500 ml (1200ml intraop, 300 ml per vagina) URINE OUTPUT: 750 ml clear yellow SPECIMENS: Placenta sent to pathology COMPLICATIONS: Postpartum hemorrhage  PROCEDURE IN DETAIL:  The patient received intravenous antibiotics and had sequential compression devices applied to her lower extremities while in the preoperative area.  She was then taken to the operating room where spinal anesthesia was administered  (epidural anesthesia was dosed up to surgical level) and was found to be adequate. She was then placed in a dorsal supine position with a leftward tilt, and prepped and draped in a sterile manner.  A foley catheter was placed into her bladder and attached to constant gravity, which drained clear fluid throughout.  After an adequate timeout was performed, a Pfannenstiel skin incision was made with scalpel and carried through the underlying layer of fascia. The fascia was extended bilaterally bluntly. The rectus muscles were separated in the midline bluntly and the peritoneum was entered bluntly. A bladder blade was placed. Attention was turned to the lower uterine segment where a transverse hysterotomy was made with a scalpel and extended bilaterally bluntly. The infant was successfully delivered, and cord was clamped and cut and infant was handed over to awaiting neonatology team. Uterine massage was then administered and the placenta delivered intact with three-vessel cord. The uterus was then cleared of clot and debris. Uterine atony was recognized, pitocin was running, a total of 0.4mg  IV Methergine was given and 1000mcg cytotec PR was given. The hysterotomy was closed with 0 Vicryl in a running locked fashion, and an imbricating layer was also placed with a 0 Vicryl. A couple of interrupted figure-eight sutures with 2-0 Vicryl were placed with good hemostasis of uterine incision closure. Irrigation was performed. Arista was placed over uterine incision site. Uterine atony was still recognized, therefore a Bakri balloon was placed per vagina, insufflated to 300cc with good hemostasis noted and improved uterine tone. Overall, excellent hemostasis was noted. The abdomen and the pelvis were cleared of all clot and debris. Hemostasis was confirmed on all surfaces.  The peritoneum was reapproximated using 2-0 vicryl running stitches. The fascia was then closed using 0 Vicryl in a running fashion. The skin was closed  with 4-0 vicryl on a Lindh needle.  The patient tolerated the procedure well. Sponge, lap, instrument and needle counts were correct x 2. She was taken to the recovery room in stable condition.    Samantha OfferElizabeth Woodland Delvis Kau, DO  OB Fellow [facility], [facility] 09/14/2016 2:07 PM

## 2016-09-14 NOTE — Anesthesia Postprocedure Evaluation (Signed)
Anesthesia Post Note  Patient: Samantha Hardin  Procedure(s) Performed: Procedure(s) (LRB): CESAREAN SECTION (N/A) BAKRI BALLOON PLACEMENT (N/A)  Patient location during evaluation: PACU Anesthesia Type: Epidural Level of consciousness: awake Pain management: pain level controlled Respiratory status: spontaneous breathing Cardiovascular status: stable Postop Assessment: no headache, no backache, epidural receding, no signs of nausea or vomiting and patient able to bend at knees Anesthetic complications: no     Last Vitals:  Vitals:   09/14/16 1418 09/14/16 1430  BP: 138/77 (!) 148/89  Pulse: 94 91  Resp: 19 17  Temp:      Last Pain:  Vitals:   09/14/16 1445  TempSrc:   PainSc: 5    Pain Goal: Patients Stated Pain Goal: 1 (09/13/16 2139)               Zayvon Alicea JR,JOHN Susann GivensFRANKLIN

## 2016-09-14 NOTE — Transfer of Care (Signed)
Immediate Anesthesia Transfer of Care Note  Patient: Samantha Hardin  Procedure(s) Performed: Procedure(s): CESAREAN SECTION (N/A) BAKRI BALLOON PLACEMENT (N/A)  Patient Location: PACU  Anesthesia Type:Epidural  Level of Consciousness: awake, alert  and oriented  Airway & Oxygen Therapy: Patient Spontanous Breathing  Post-op Assessment: Report given to RN and Post -op Vital signs reviewed and stable  Post vital signs: Reviewed and stable  Last Vitals:  Vitals:   09/14/16 1200 09/14/16 1209  BP: (!) 146/94   Pulse: 100 (!) 105  Resp:    Temp:      Last Pain:  Vitals:   09/14/16 1230  TempSrc:   PainSc: 0-No pain      Patients Stated Pain Goal: 1 (09/13/16 2139)  Complications: No apparent anesthesia complications

## 2016-09-15 LAB — CBC
HCT: 27.7 % — ABNORMAL LOW (ref 36.0–46.0)
Hemoglobin: 9.7 g/dL — ABNORMAL LOW (ref 12.0–15.0)
MCH: 29.8 pg (ref 26.0–34.0)
MCHC: 35 g/dL (ref 30.0–36.0)
MCV: 85.2 fL (ref 78.0–100.0)
PLATELETS: 162 10*3/uL (ref 150–400)
RBC: 3.25 MIL/uL — AB (ref 3.87–5.11)
RDW: 14 % (ref 11.5–15.5)
WBC: 19.3 10*3/uL — AB (ref 4.0–10.5)

## 2016-09-15 LAB — CREATININE, SERUM
CREATININE: 0.7 mg/dL (ref 0.44–1.00)
GFR calc Af Amer: >60 mL/min (ref >60)
GFR calc non Af Amer: >60 mL/min (ref >60)

## 2016-09-15 MED ORDER — RHO D IMMUNE GLOBULIN 1500 UNIT/2ML IJ SOSY
300.0000 ug | PREFILLED_SYRINGE | Freq: Once | INTRAMUSCULAR | Status: AC
  Administered 2016-09-15: 300 ug via INTRAVENOUS
  Filled 2016-09-15: qty 2

## 2016-09-15 NOTE — Progress Notes (Signed)
CSW acknowledges consult.  CSW attempted to meet with [name], however [name] had several room guest.  CSW will attempt to visit with [name] at a later time.   Tvisha Schwoerer Boyd-Gilyard, MSW, LCSW Clinical Social Work [phone]  

## 2016-09-15 NOTE — Progress Notes (Signed)
This PM bakri slowly deflated 50 cc every 1/2 hour. I removed the bakri after the final fluid was removed. Fundus firm, bleeding small. Will discontinue foley catheter and antibiotics. Expectant mgmt.

## 2016-09-15 NOTE — Progress Notes (Signed)
POSTPARTUM PROGRESS NOTE  Post Partum Day 1 Subjective:  Samantha Hardin is a 25 y.o. [id number] 316w5d s/p pltcs. Performed for fetal indications. Complicated by intraop pph 2/2 atony. Bakri in place. Sleepy, no palpitations or sob.  No acute events overnight.  Pt denies problems with ambulating, voiding or po intake.  She denies nausea or vomiting.  Pain is well controlled.  She has had flatus. She has not had bowel movement.  Lochia Moderate.   Objective: Blood pressure 117/66, pulse 98, temperature 98.1 F (36.7 C), temperature source Oral, resp. rate 18, height 5\' 6"  (1.676 m), weight 208 lb (94.3 kg), last menstrual period 12/24/2015, SpO2 98 %, unknown if currently breastfeeding.  Physical Exam:  General: alert, cooperative and no distress Lochia:normal flow Chest: CTAB Heart: RRR no m/r/g Abdomen: +BS, soft, nontender,  Uterine Fundus: firm, pressure bandage in place and is clean DVT Evaluation: No calf swelling or tenderness Extremities: trace edema   Recent Labs  09/14/16 1809 09/15/16 0541  HGB 11.8* 9.7*  HCT 35.5* 27.7*    Assessment/Plan:  ASSESSMENT: Samantha HugerLauresa D Hardin is a 25 y.o. [id number] 336w5d s/p pltcs c/b pph. Bleeding now appropriate. Will begin deflating Bakri at noon with plan to d/c and remove packing. After that will d/c prophylactic antibiotics and remove Foley. Is s/p 2 units PRBCs and H/H is appropriate. Rhogam w/u ordered.   LOS: 1 day   Silvano Bilisoah B Monserrat Vidaurri 09/15/2016, 9:43 AM

## 2016-09-16 LAB — RH IG WORKUP (INCLUDES ABO/RH)
ABO/RH(D): AB NEG
Fetal Screen: NEGATIVE
GESTATIONAL AGE(WKS): 38.5
Unit division: 0

## 2016-09-16 MED ORDER — FERROUS SULFATE 325 (65 FE) MG PO TABS
325.0000 mg | ORAL_TABLET | Freq: Two times a day (BID) | ORAL | Status: DC
  Administered 2016-09-16 – 2016-09-17 (×3): 325 mg via ORAL
  Filled 2016-09-16 (×3): qty 1

## 2016-09-16 NOTE — Progress Notes (Signed)
POSTPARTUM PROGRESS NOTE  Post Partum Day 2 Subjective:  Samantha HugerLauresa D Kines is a 25 y.o. [id number] s/p PLTC 2/2 NRFHT at 2646w5d.  Complicated by intraoperative PPH 2/2 atony. Bakri was placed and removed yesterday. Hemoglobin has been stable  This morning, patient denies any complaints. No problems with ambulating, voiding or po intake.  She denies nausea or vomiting.  Pain is well controlled.  She has had flatus. She has not had bowel movement.  Lochia Moderate.   Objective: Blood pressure 127/73, pulse 88, temperature 98.4 F (36.9 C), temperature source Oral, resp. rate 14, height 5\' 6"  (1.676 m), weight 208 lb (94.3 kg), last menstrual period 12/24/2015, SpO2 99 %, unknown if currently breastfeeding.  Physical Exam:  General: alert, cooperative and no distress Lochia:normal flow Chest: CTAB Heart: RRR no m/r/g Abdomen: +BS, soft, nontender,  Uterine Fundus: firm, pressure bandage in place and is clean DVT Evaluation: No calf swelling or tenderness Extremities: trace edema   Recent Labs  09/14/16 1809 09/15/16 0541  HGB 11.8* 9.7*  HCT 35.5* 27.7*    Assessment/Plan:  ASSESSMENT: Samantha Hardin is a 25 y.o. [id number]  POD#2 s/p LTCS for NRFHT complicated by PPH.  Stable currently. Ferrous sulfate started for asymptomatic mild anemia. Bottle feeding. Desires Depo Provera for BCM. Likely discharge later today or tomorrow if remains stable.    LOS: 2 days   Krishana Lutze A , MD 09/16/2016, 6:45 AM

## 2016-09-16 NOTE — Progress Notes (Signed)
CSW attempted to meet with [name], however, [name] was asleep.  CSW will attempt to meet with [name] at a later time.   Blaine HamperAngel Boyd-Gilyard, MSW, LCSW Clinical Social Work [phone]

## 2016-09-16 NOTE — Clinical Social Work Maternal (Addendum)
  CLINICAL SOCIAL WORK MATERNAL/CHILD NOTE  Patient Details  Name: Samantha Hardin MRN: [id number] Date of Birth: January 26, 1991  Date:  09/16/2016  Clinical Social Worker Initiating Note:  Laurey Arrow Date/ Time Initiated:  09/16/16/1153     Child's Name:  Samantha Hardin   Legal Guardian:  Mother   Need for Interpreter:  None   Date of Referral:  09/16/16     Reason for Referral:  Behavioral Health Issues, including SI    Referral Source:  [facility]   Address:  [place] [address]  Phone number:  [phone]   Household Members:  Self, Siblings, Parents   Natural Supports (not living in the home):  Spouse/significant other, Immediate Family, Friends   Chiropodist: None   Employment: Animator   Type of Work: Paediatric nurse   Education:  Garment/textile technologist:   (CSW provided [name] with information to apply for Physicist, medical and [place].)   Other Resources:      Cultural/Religious Considerations Which May Impact Care:  None Reported  Strengths:  Ability to meet basic needs , Engineer, materials , Home prepared for child    Risk Factors/Current Problems:  Mental Health Concerns    Cognitive State:  Alert , Able to Concentrate , Linear Thinking , Insightful    Mood/Affect:  Flat , Calm , Comfortable , Interested    CSW Assessment: CSW met with [name] to complete an assessment for MH hx.  [name] wa polite and inviting. With [name]'s permission, CSW asked FOB (Davon Lynch) to step outside of room in effort for CSW to meet with [name] in private.  CSW inquired about [name]'s MH hx and [name] denied any hx.  [name] communicated that [name] experienced a 24 MH hold at [facility] due to [name]'s response to a questions about SI.  [name] communicated that she has never been depressed and did experience SI when she was a teenager.  [name] denied SI and HI during the assessment. CSW educated [name] about PPD. CSW informed [name] of possible supports and  interventions to decrease PPD.  CSW also encouraged [name] to seek medical attention if needed for increased signs and symptoms for PPD. CSW reviewed safe sleep, and SIDS. [name] asked appropriate questions and appeared to be knowledgeable.  [name] communicated that she has a crib for the baby, and feels prepared for the infant.  [name] did not have any further questions, concerns, or needs at this time.  CSW Plan/Description:  Information/Referral to [employer] , [employer] , No Further Intervention Required/No Barriers to Discharge   Laurey Arrow, MSW, LCSW Clinical Social Work [phone]    Dimple Nanas, LCSW 09/16/2016, 11:57 AM

## 2016-09-17 LAB — TYPE AND SCREEN
ABO/RH(D): AB NEG
Antibody Screen: NEGATIVE
UNIT DIVISION: 0
UNIT DIVISION: 0
UNIT DIVISION: 0
Unit division: 0
Unit division: 0
Unit division: 0

## 2016-09-17 MED ORDER — IBUPROFEN 600 MG PO TABS: 600.0000 mg | ORAL_TABLET | Freq: Four times a day (QID) | ORAL | 0 refills | Status: DC

## 2016-09-17 MED ORDER — OXYCODONE HCL 5 MG PO TABS: 5.0000 mg | ORAL_TABLET | ORAL | 0 refills | Status: DC | PRN

## 2016-09-17 NOTE — Discharge Summary (Signed)
Obstetric Discharge Summary Reason for Admission: onset of labor and 38.[redacted] weeks gestation Prenatal Procedures: ultrasound, uncomplicated prenatal course Intrapartum Procedures: cesarean: low cervical, transverse and for fetal bradycardia, intraoperative PPH (atony)-EBL 1200 mL, Bakri balloon; AROM-clear Postpartum Procedures: transfusion 2 units PRBCs and Rho(D) Ig Complications-Operative and Postpartum: hemorrhage Hemoglobin  Date Value Ref Range Status  09/15/2016 9.7 (L) 12.0 - 15.0 g/dL Final   HCT  Date Value Ref Range Status  09/15/2016 27.7 (L) 36.0 - 46.0 % Final    Physical Exam:  General: alert, cooperative and no distress Lochia: appropriate Uterine Fundus: firm Incision: healing well, no significant drainage, no dehiscence, no significant erythema; honeycomb intact with old drk drainage, steri strips in place. DVT Evaluation: No evidence of DVT seen on physical exam. Negative Homan's sign. No cords or calf tenderness. No significant calf/ankle edema.  Discharge Diagnoses: Term Pregnancy-delivered; S/p Cesarean Section  Discharge Information: Date: 09/17/2016 Activity: pelvic rest Diet: routine Medications: PNV, Ibuprofen and Percocet Condition: stable Instructions: refer to practice specific booklet Discharge to: home Follow-up Information    [facility] Schedule an appointment as soon as possible for a visit in 6 week(s).   Specialty:  Obstetrics and Gynecology Contact information: [address] [place] [phone]          Newborn Data: Live born female  Birth Weight: 7 lb 15.9 oz (3625 g) APGAR: 9, 9  Home with mother.  Samantha LarryMelanie Jermon Hardin, CNM 09/17/2016, 7:44 AM

## 2016-09-17 NOTE — Progress Notes (Signed)
Pt discharged to home with significant other and baby.  Condition stable.  Pt ambulated to car with K. Nilda CalamityGbarbea, NT.  No equipment for home ordered at discharge.

## 2016-09-21 ENCOUNTER — Encounter: Payer: BLUE CROSS/BLUE SHIELD | Admitting: Certified Nurse Midwife

## 2016-10-13 ENCOUNTER — Telehealth: Payer: Self-pay

## 2016-10-21 NOTE — Telephone Encounter (Signed)
Notified pt that her Return to Work form has been completed and faxed.  She can come pick up her copy from the front office at her convenience. Pt stated thank you with no further questions.

## 2016-10-28 ENCOUNTER — Ambulatory Visit: Payer: BLUE CROSS/BLUE SHIELD | Admitting: Advanced Practice Midwife

## 2017-01-03 IMAGING — US US OB TRANSVAGINAL
1 series · 15 of 28 positions shown · non-contrast
Comparison: None.

CLINICAL DATA: Establish gestational age.

EXAM:
OBSTETRIC <14 WK US AND TRANSVAGINAL OB US
TECHNIQUE: Both transabdominal and transvaginal ultrasound examinations were
performed for complete evaluation of the gestation as well as the
maternal uterus, adnexal regions, and pelvic cul-de-sac.
Transvaginal technique was performed to assess early pregnancy.

[Series 1: us ob transvaginal · 91 acquisitions, 15 frames shown]
[im 1/91]
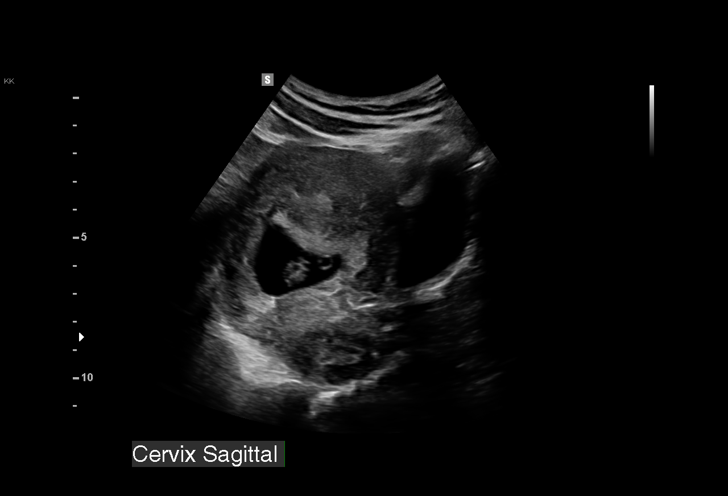
[im 7/91]
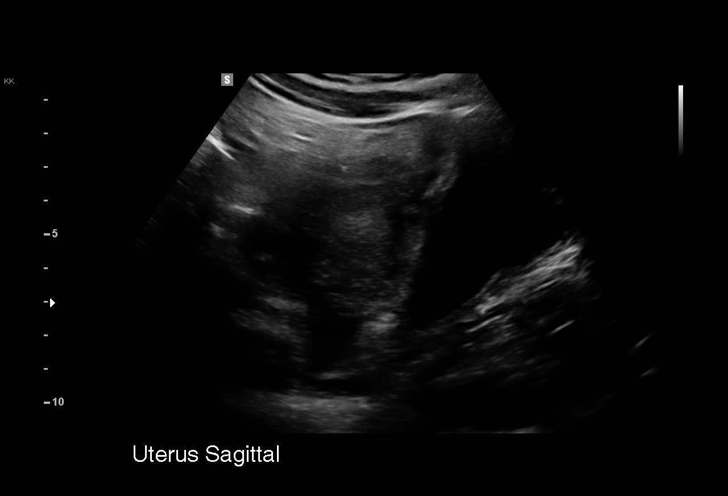
[im 14/91]
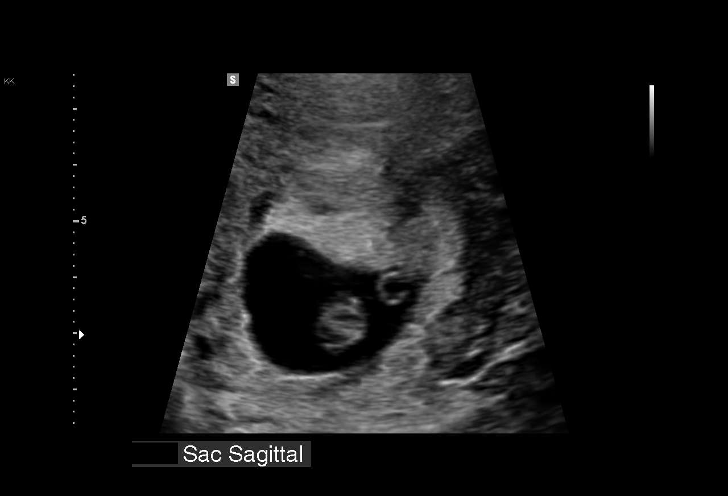
[im 21/91]
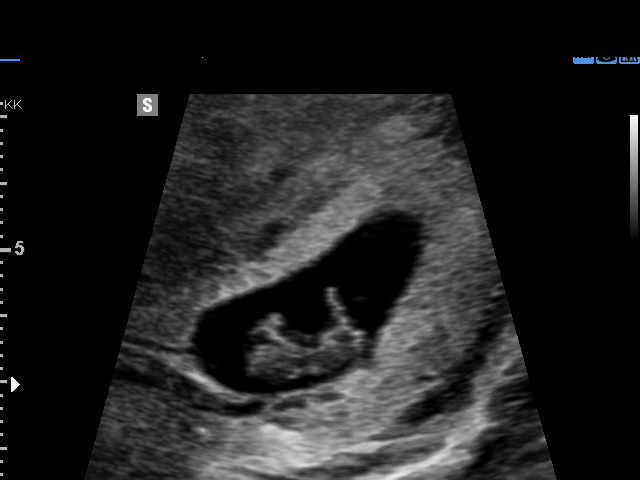
[im 27/91]
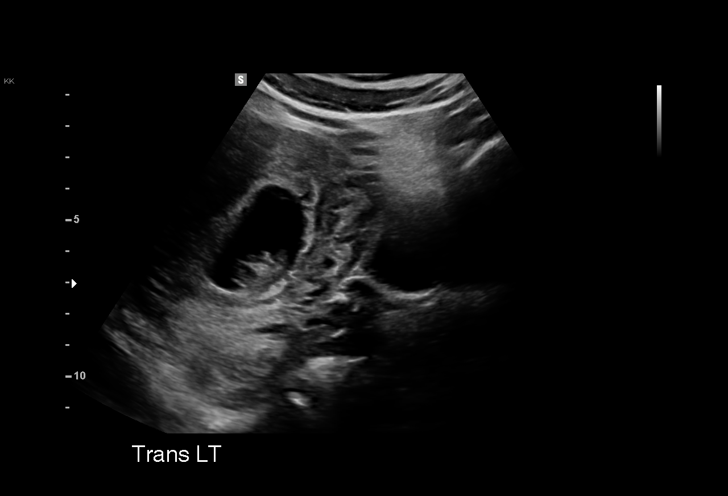
[im 34/91]
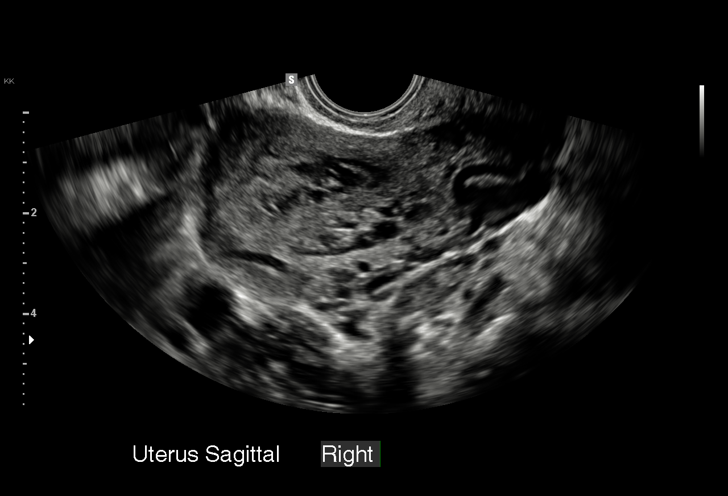
[im 41/91]
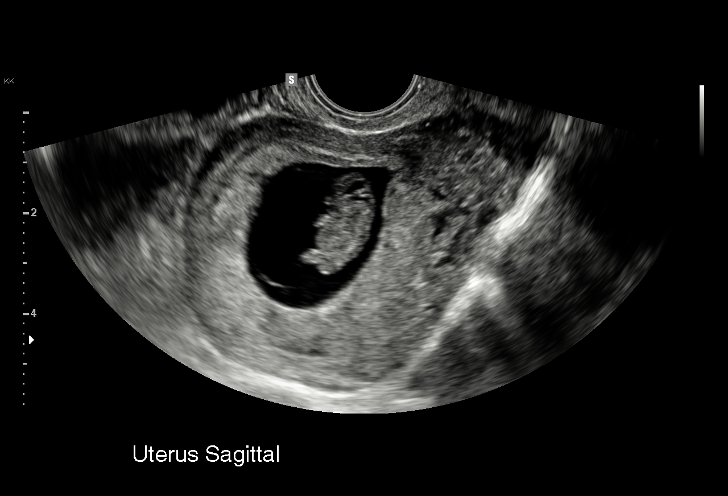
[im 47/91]
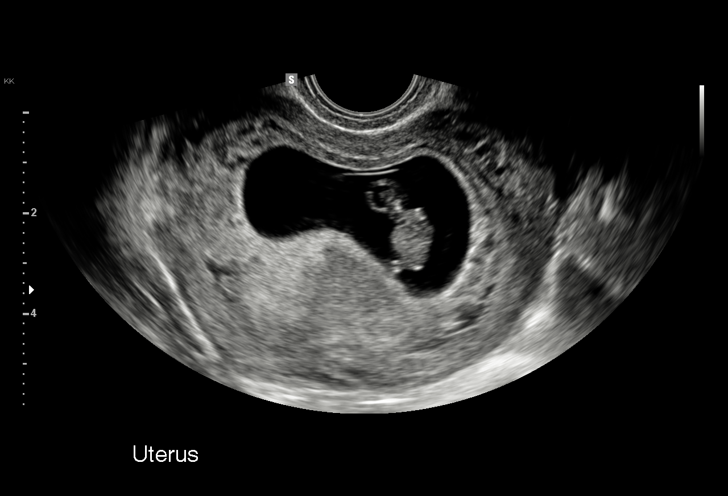
[im 51/91]
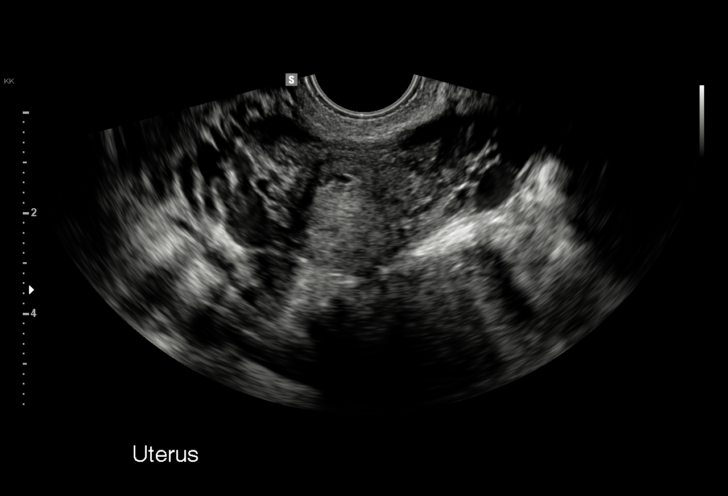
[im 57/91]
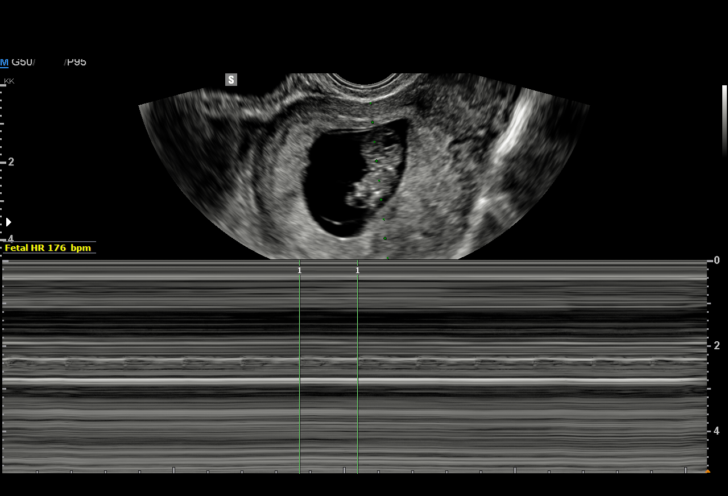
[im 64/91]
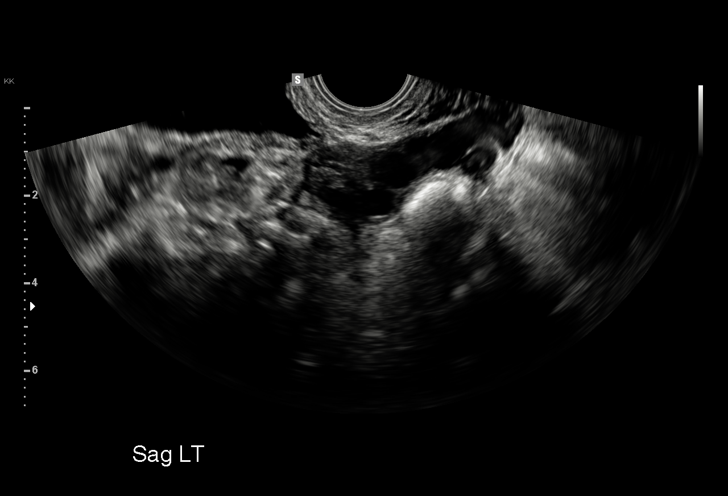
[im 71/91]
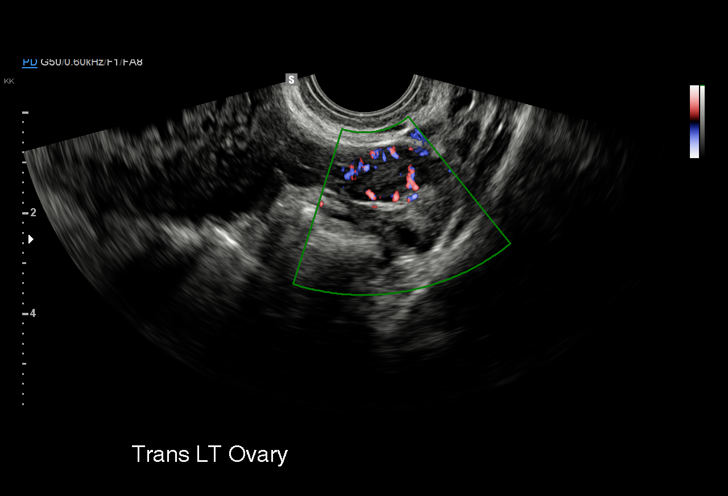
[im 77/91]
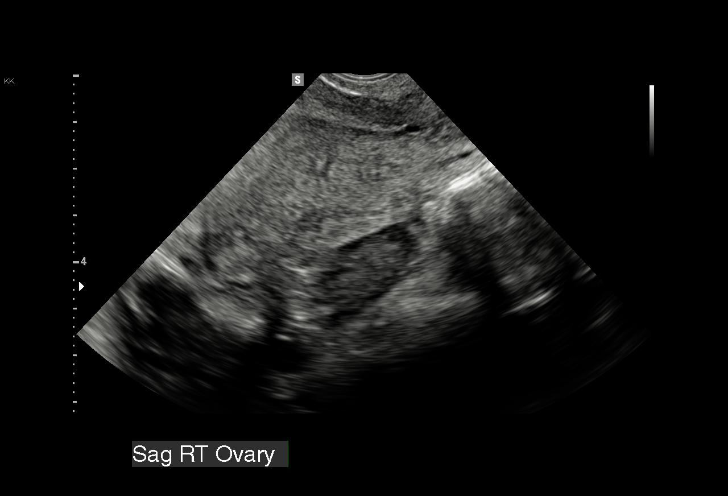
[im 84/91]
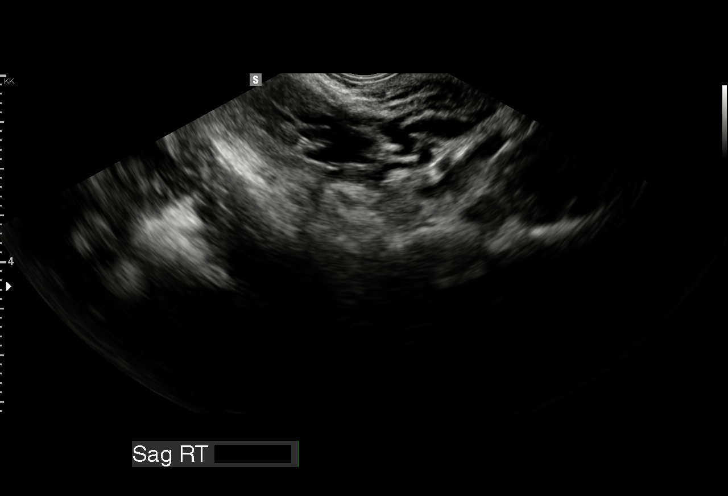
[im 91/91]
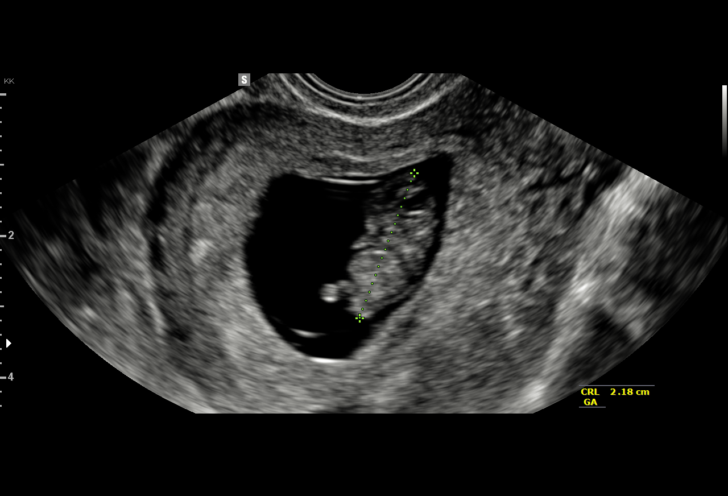

[15 of 28 positions shown; findings below may reference images not displayed]

FINDINGS: Intrauterine gestational sac: Present

Yolk sac:  Present

Embryo:  Present

Cardiac Activity: Present

Heart Rate: 176  bpm

CRL:  21.9  mm   8 w   6 d                  US EDC: 09/23/2016

Subchorionic hemorrhage:  None visualized.

Maternal uterus/adnexae: Normal right and left ovaries. No free
fluid in the pelvis.
IMPRESSION: Single live intrauterine gestation.  No subchorionic hemorrhage.

## 2017-05-15 ENCOUNTER — Encounter (HOSPITAL_COMMUNITY): Payer: Self-pay

## 2017-05-15 ENCOUNTER — Emergency Department (HOSPITAL_COMMUNITY)
Admission: EM | Admit: 2017-05-15 | Discharge: 2017-05-15 | Disposition: A | Discharge status: Home / Self Care | Payer: BLUE CROSS/BLUE SHIELD | Attending: Emergency Medicine | Admitting: Emergency Medicine

## 2017-05-15 DIAGNOSIS — J45909 Unspecified asthma, uncomplicated: Secondary | ICD-10-CM | POA: Diagnosis not present

## 2017-05-15 DIAGNOSIS — T814XXA Infection following a procedure, initial encounter: Secondary | ICD-10-CM | POA: Diagnosis not present

## 2017-05-15 DIAGNOSIS — Y763 Surgical instruments, materials and obstetric and gynecological devices (including sutures) associated with adverse incidents: Secondary | ICD-10-CM | POA: Insufficient documentation

## 2017-05-15 DIAGNOSIS — Z87891 Personal history of nicotine dependence: Secondary | ICD-10-CM | POA: Diagnosis not present

## 2017-05-15 DIAGNOSIS — Z79899 Other long term (current) drug therapy: Secondary | ICD-10-CM | POA: Diagnosis not present

## 2017-05-15 DIAGNOSIS — IMO0001 Reserved for inherently not codable concepts without codable children: Secondary | ICD-10-CM

## 2017-05-15 DIAGNOSIS — Z91040 Latex allergy status: Secondary | ICD-10-CM | POA: Insufficient documentation

## 2017-05-15 MED ORDER — SULFAMETHOXAZOLE-TRIMETHOPRIM 800-160 MG PO TABS: 2.0000 | ORAL_TABLET | Freq: Two times a day (BID) | ORAL | 0 refills | Status: AC

## 2017-05-15 NOTE — ED Triage Notes (Signed)
Pt noted bump in incision area near c-section/November.  Pt states it is now draining.

## 2017-05-15 NOTE — ED Provider Notes (Signed)
[facility]-EMERGENCY DEPT Provider Note   CSN: [id number] Arrival date & time: 05/15/17  0753     History   Chief Complaint Chief Complaint  Patient presents with  . Open Wound    HPI Samantha Hardin is a 26 y.o. female.  HPI Patient presents to the emergency department with drainage from her incision site after C-section.  The patient states her C-section was in November.  She states that she has had some discomfort in the area of the incision site but noticed several days ago that there was a small amount of drainage from one area of the wound.  Patient states that she has had no other issues with the incision site. The patient denies chest pain, shortness of breath, headache,blurred vision, neck pain, fever, cough, weakness, numbness, dizziness, anorexia, edema, abdominal pain, nausea, vomiting, diarrhea, rash, back pain, dysuria, hematemesis, bloody stool, near syncope, or syncope.  Patient states that palpation of the area makes the pain worse Past Medical History:  Diagnosis Date  . Anemia   . Asthma   . Depression    never on meds, doing ok  . Headache(784.0)    when on birth control  . Heart murmur   . Migraines     Patient Active Problem List   Diagnosis Date Noted  . Uterine contractions during pregnancy 09/14/2016  . Rh negative, antepartum 05/27/2016  . Encounter for supervision of other normal pregnancy in first trimester 02/18/2016    Past Surgical History:  Procedure Laterality Date  . CESAREAN SECTION N/A 09/14/2016   Procedure: CESAREAN SECTION;  Surgeon: Levie Heritage, DO;  Location: [facility] BIRTHING SUITES;  Service: Obstetrics;  Laterality: N/A;  . INDUCED ABORTION    . PERINEAL LACERATION REPAIR N/A 09/14/2016   Procedure: Larene Pickett BALLOON PLACEMENT;  Surgeon: Levie Heritage, DO;  Location: [facility] BIRTHING SUITES;  Service: Obstetrics;  Laterality: N/A;    OB History    Gravida Para Term Preterm AB Living   3 1 1  0 2 1   SAB TAB Ectopic Multiple Live Births   0 2 0 0 1       Home Medications    Prior to Admission medications   Medication Sig Start Date End Date Taking? Authorizing Provider  albuterol (PROVENTIL HFA;VENTOLIN HFA) 108 (90 Base) MCG/ACT inhaler Inhale 2 puffs into the lungs every 6 (six) hours as needed for wheezing or shortness of breath.    [provider]  ibuprofen (ADVIL,MOTRIN) 600 MG tablet Take 1 tablet (600 mg total) by mouth every 6 (six) hours. 09/17/16   Donette Larry, CNM  oxyCODONE (OXY IR/ROXICODONE) 5 MG immediate release tablet Take 1 tablet (5 mg total) by mouth every 4 (four) hours as needed (pain scale 4-7). 09/17/16   Donette Larry, CNM    Family History Family History  Problem Relation Age of Onset  . Heart disease Maternal Grandfather   . Diabetes Maternal Grandfather   . Hypertension Maternal Grandfather   . Anesthesia problems Neg Hx     Social History Social History  Substance Use Topics  . Smoking status: Former Smoker    Packs/day: 0.25    Types: Cigarettes    Quit date: 10/26/2014  . Smokeless tobacco: Never Used  . Alcohol use No     Allergies   Latex   Review of Systems Review of Systems All other systems negative except as documented in the HPI. All pertinent positives and negatives as reviewed in the HPI.  Physical Exam Updated Vital  Signs BP 110/72 (BP Location: Left Arm)   Pulse 62   Temp 98.4 F (36.9 C) (Oral)   Resp 16   Ht 5\' 6"  (1.676 m)   Wt 71.4 kg (157 lb 8 oz)   LMP 05/15/2017   SpO2 100%   BMI 25.42 kg/m   Physical Exam  Constitutional: She is oriented to person, place, and time. She appears well-developed and well-nourished. No distress.  HENT:  Head: Normocephalic and atraumatic.  Eyes: Pupils are equal, round, and reactive to light.  Pulmonary/Chest: Effort normal.  Abdominal:    Neurological: She is alert and oriented to person, place, and time.  Skin: Skin is warm and dry.  Psychiatric: She has a normal mood and affect.    Nursing note and vitals reviewed.    ED Treatments / Results  Labs (all labs ordered are listed, but only abnormal results are displayed) Labs Reviewed - No data to display  EKG  EKG Interpretation None       Radiology No results found.  Procedures Procedures (including critical care time)  Medications Ordered in ED Medications - No data to display   Initial Impression / Assessment and Plan / ED Course  I have reviewed the triage vital signs and the nursing notes.  Pertinent labs & imaging results that were available during my care of the patient were reviewed by me and considered in my medical decision making (see chart for details).   .  We will give the patient antibiotics.  Told to use warm compresses around the area.  She has not had any hypotension, fever or tachycardia.  The patient most likely has a small superficial infection of the wound.  This could have derived from a seroma that has been present.  Patient is advised to follow-up with her GYN doctor or the women's hospital clinic.  Advised her to return here for any worsening in her condition.  Patient agrees to the plan and all questions were answered   Final Clinical Impressions(s) / ED Diagnoses   Final diagnoses:  None    New Prescriptions New Prescriptions   No medications on file     Kyra MangesLawyer, Gift Rueckert, PA-C 05/16/17 [id number]    Rolland PorterJames, Mark, MD 05/16/17 [phone]

## 2017-05-15 NOTE — Discharge Instructions (Signed)
Follow up with the Clinic provided. Return here as needed/

## 2017-07-24 IMAGING — US US MFM OB FOLLOW-UP
1 of 2 series · 14 of 28 positions shown · non-contrast
Comparison: none

[Series 2: us mfm ob follow-up · 14 of 53 slices shown]
[im 1/53]
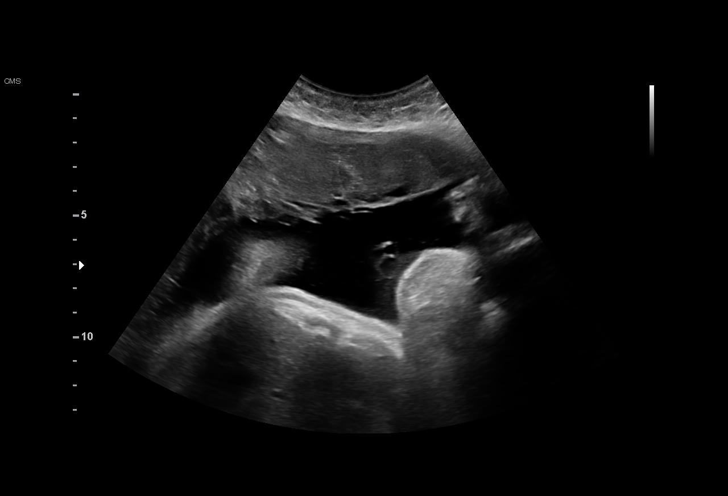
[im 5/53]
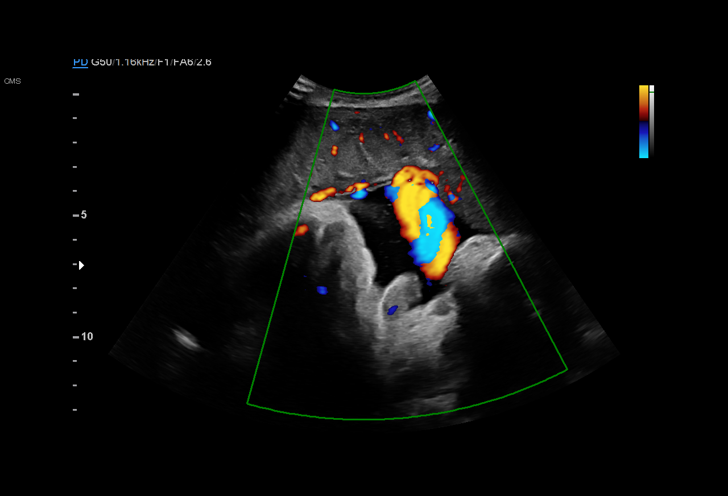
[im 9/53]
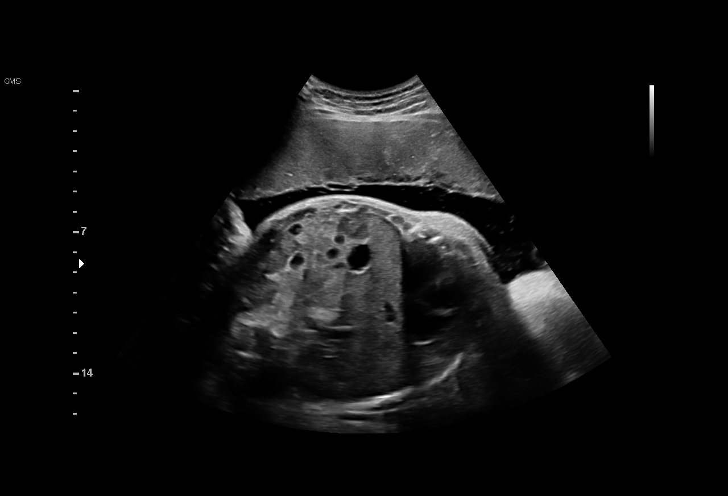
[im 13/53]
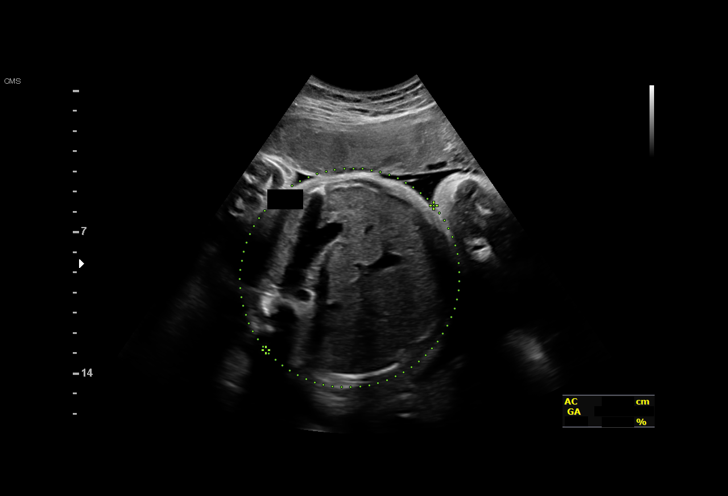
[im 17/53]
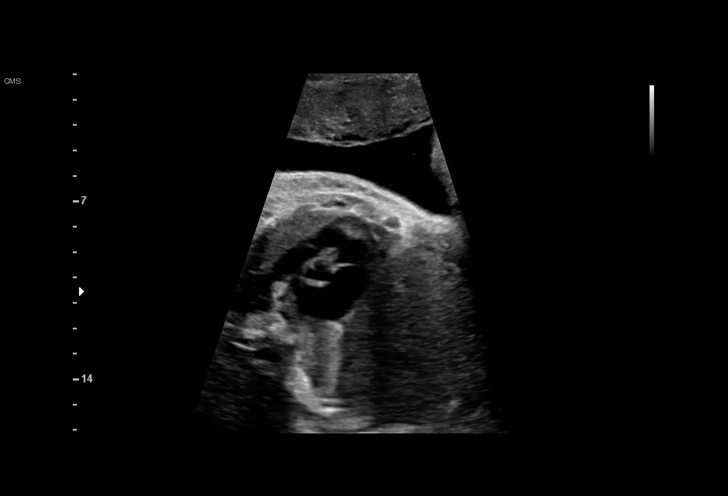
[im 21/53]
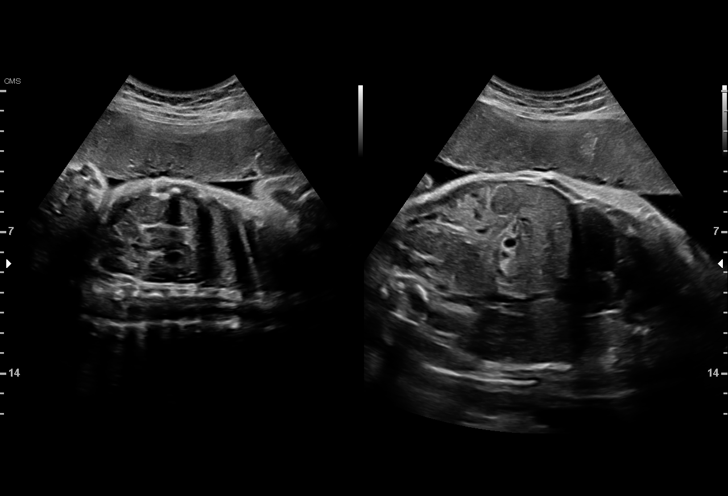
[im 25/53]
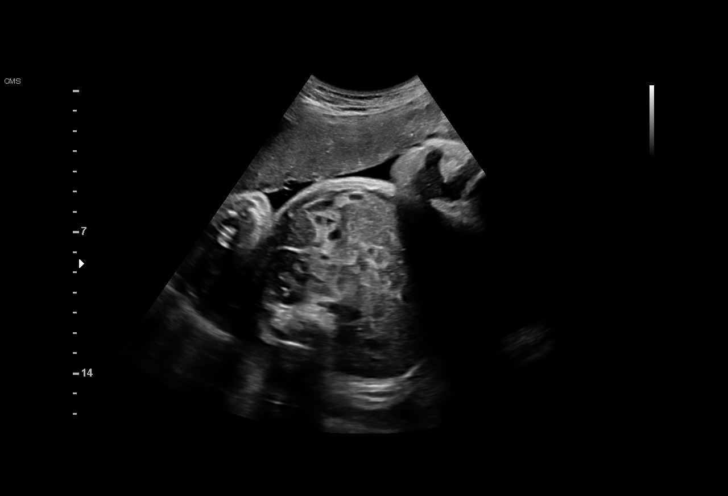
[im 29/53]
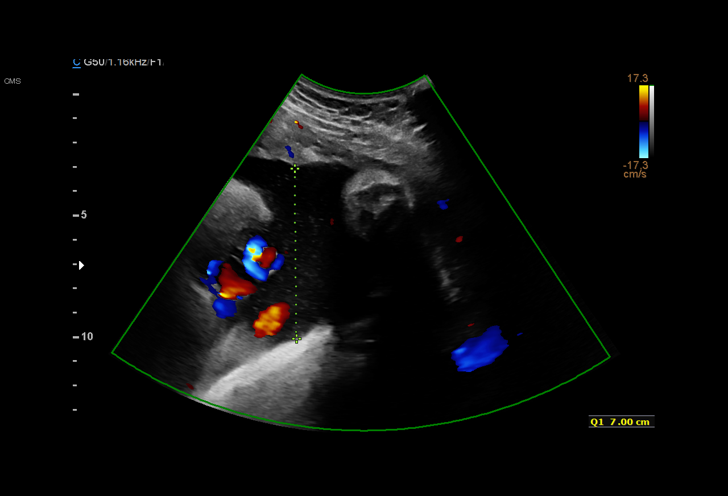
[im 33/53]
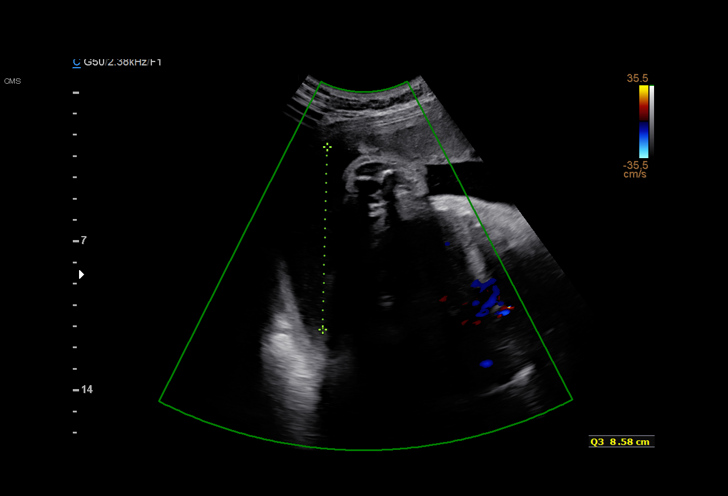
[im 37/53]
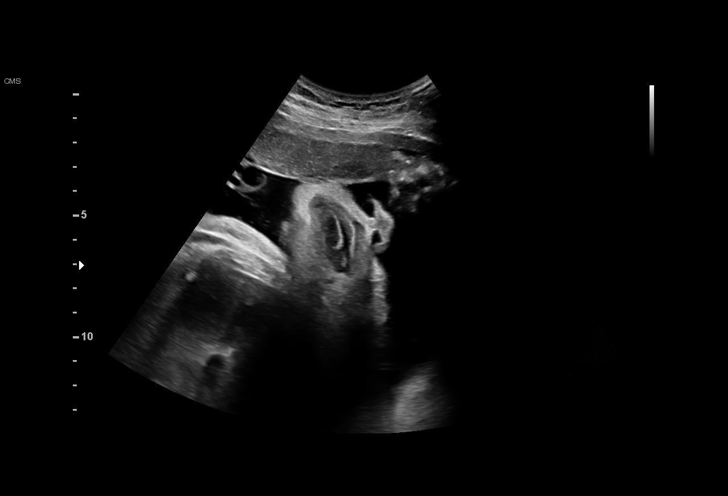
[im 41/53]
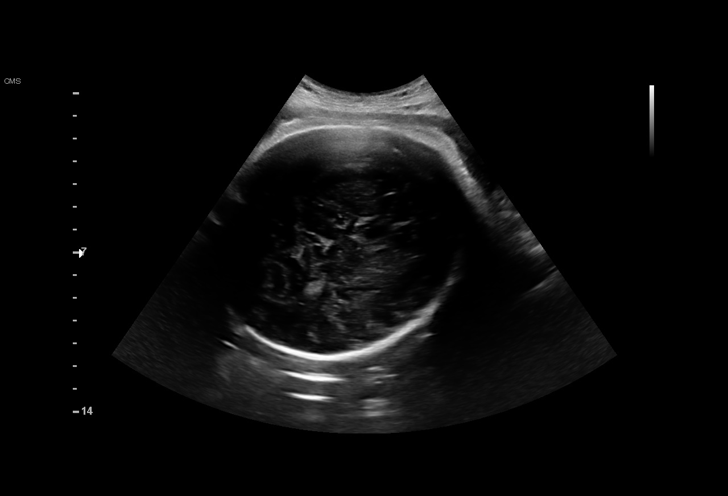
[im 45/53]
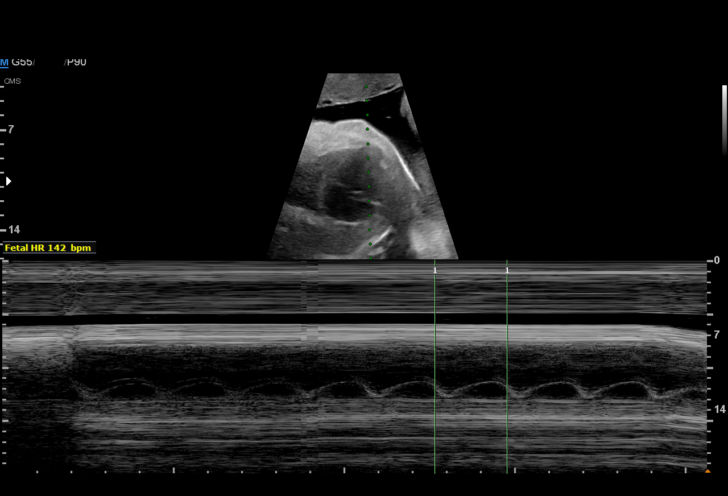
[im 49/53]
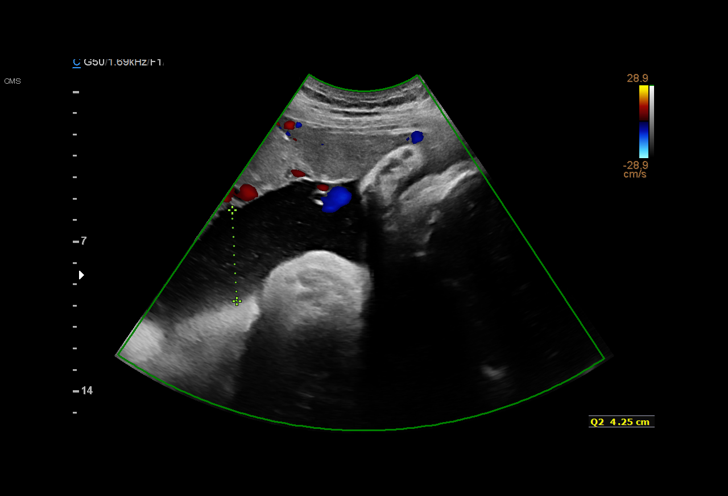
[im 53/53]
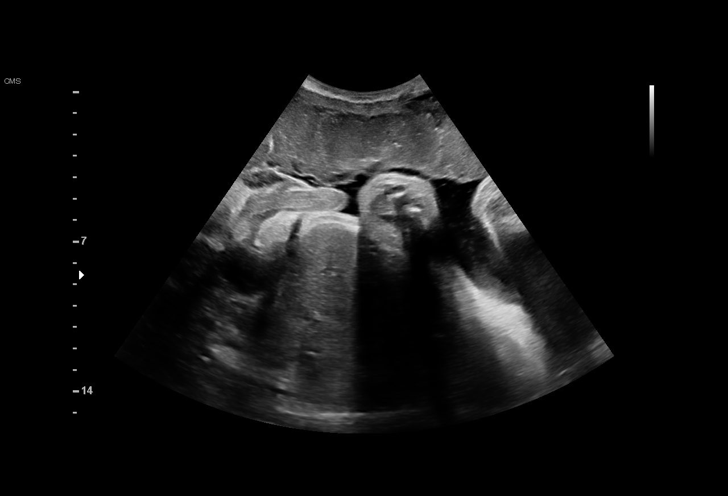

[14 of 28 positions shown; findings below may reference images not displayed]

OB/Gyn Clinic

SEHER
Indications

37 weeks gestation of pregnancy
Antenatal follow-up for nonvisualized fetal
anatomy
Uterine size-date discrepancy, third trimester
(S<D)
OB History

Gravidity:    2
TOP:          1
Fetal Evaluation

Num Of Fetuses:     1
Fetal Heart         142
Rate(bpm):
Cardiac Activity:   Observed
Presentation:       Cephalic
Placenta:           Anterior, above cervical os
P. Cord Insertion:  Visualized, central

Amniotic Fluid
AFI FV:      Subjectively within normal limits

AFI Sum(cm)     %Tile       Largest Pocket(cm)
21.58           85
RUQ(cm)       RLQ(cm)       LUQ(cm)        LLQ(cm)
4.44
Biometry

BPD:      99.5  mm     G. Age:  40w 6d       > 99  %    CI:        76.02   %    70 - 86
FL/HC:      20.0   %    20.9 -
HC:      361.7  mm     G. Age:  N/A          > 97  %    HC/AC:      1.07        0.92 -
AC:      337.8  mm     G. Age:  37w 5d         65  %    FL/BPD:     72.8   %    71 - 87
FL:       72.4  mm     G. Age:  37w 1d         36  %    FL/AC:      21.4   %    20 - 24
HUM:      63.6  mm     G. Age:  36w 6d         60  %

Est. FW:    1522  gm    7 lb 11 oz      86  %
Gestational Age

LMP:           36w 6d        Date:  12/24/15                 EDD:   09/29/16
U/S Today:     38w 4d                                        EDD:   09/17/16
Best:          37w 5d     Det. By:  Early Ultrasound         EDD:   09/23/16
(02/18/16)
Anatomy

Cranium:               Appears normal         Aortic Arch:            Previously seen
Cavum:                 Appears normal         Ductal Arch:            Not well visualized
Ventricles:            Appears normal         Diaphragm:              Appears normal
Choroid Plexus:        Appears normal         Stomach:                Appears normal, left
sided
Cerebellum:            Previously seen        Abdomen:                Appears normal
Posterior Fossa:       Previously seen        Abdominal Wall:         Appears nml (cord
insert, abd wall)
Nuchal Fold:           Previously seen        Cord Vessels:           Previously seen
Face:                  Appears normal         Kidneys:                Appear normal
(orbits and profile)
Lips:                  Appears normal         Bladder:                Appears normal
Thoracic:              Appears normal         Spine:                  Not well visualized
Heart:                 Appears normal         Upper Extremities:      Previously seen
(4CH, axis, and situs
RVOT:                  Appears normal         Lower Extremities:      Previously seen
LVOT:                  Appears normal

Other:  Male gender. Technically difficult due to advanced gestational age.
Cervix Uterus Adnexa

Cervix
Not visualized (advanced GA >02wks)

Uterus
No abnormality visualized.

Left Ovary
Not visualized.

Right Ovary
Not visualized.

Cul De Sac:   No free fluid seen.

Adnexa:       No abnormality visualized.
Impression

Singleton intrauterine pregnancy at 37+5 weeks, size.dates
discrepancy
Review of the anatomy shows no sonographic markers for
aneuploidy or structural anomalies
However, evaluations should be considered suboptimal
secondary to late gestational age
Amniotic fluid volume is normal with an AFI of 21.6 cm.
Estimated fetal weight is 3500g which is growth in the 86th
percentile
Recommendations

Growth is LGA, but not macrosomic. Follow-up ultrasounds
as clinically indicated.

## 2018-09-19 ENCOUNTER — Other Ambulatory Visit: Payer: Self-pay

## 2018-09-19 ENCOUNTER — Encounter (HOSPITAL_COMMUNITY): Payer: Self-pay

## 2018-09-19 ENCOUNTER — Ambulatory Visit (HOSPITAL_COMMUNITY)
Admission: EM | Admit: 2018-09-19 | Discharge: 2018-09-19 | Disposition: A | Discharge status: Home / Self Care | Payer: BLUE CROSS/BLUE SHIELD | Attending: Family Medicine | Admitting: Family Medicine

## 2018-09-19 DIAGNOSIS — J069 Acute upper respiratory infection, unspecified: Secondary | ICD-10-CM | POA: Diagnosis not present

## 2018-09-19 DIAGNOSIS — J45909 Unspecified asthma, uncomplicated: Secondary | ICD-10-CM | POA: Insufficient documentation

## 2018-09-19 DIAGNOSIS — Z79899 Other long term (current) drug therapy: Secondary | ICD-10-CM | POA: Diagnosis not present

## 2018-09-19 DIAGNOSIS — Z87891 Personal history of nicotine dependence: Secondary | ICD-10-CM | POA: Diagnosis not present

## 2018-09-19 DIAGNOSIS — R05 Cough: Secondary | ICD-10-CM | POA: Diagnosis not present

## 2018-09-19 DIAGNOSIS — B9789 Other viral agents as the cause of diseases classified elsewhere: Secondary | ICD-10-CM | POA: Diagnosis not present

## 2018-09-19 DIAGNOSIS — F329 Major depressive disorder, single episode, unspecified: Secondary | ICD-10-CM | POA: Diagnosis not present

## 2018-09-19 DIAGNOSIS — Z791 Long term (current) use of non-steroidal anti-inflammatories (NSAID): Secondary | ICD-10-CM | POA: Insufficient documentation

## 2018-09-19 DIAGNOSIS — J029 Acute pharyngitis, unspecified: Secondary | ICD-10-CM

## 2018-09-19 LAB — POCT RAPID STREP A: Streptococcus, Group A Screen (Direct): NEGATIVE

## 2018-09-19 MED ORDER — CETIRIZINE HCL 10 MG PO TABS: 10.0000 mg | ORAL_TABLET | Freq: Every day | ORAL | 0 refills | Status: DC

## 2018-09-19 MED ORDER — DM-GUAIFENESIN ER 30-600 MG PO TB12: 1.0000 | ORAL_TABLET | Freq: Two times a day (BID) | ORAL | 0 refills | Status: DC

## 2018-09-19 NOTE — ED Triage Notes (Signed)
Pt cc body aches, sore throat and cough. X 5 days

## 2018-09-19 NOTE — Discharge Instructions (Addendum)
°  It was nice meeting you!!  Your rapid strep test was negative.  I believe you have a viral upper respiratory infection. It could take 7 to 10 days for the symptoms to decrease or improve.  Zyrtec and mucinex DM for the drainage in your throat and congestion.  Ibuprofen and tylenol can help with the pain.  Make sure that you are staying hydrated.  Follow up as needed for worsening symptoms.

## 2018-09-19 NOTE — ED Provider Notes (Signed)
[facility]    CSN: [id number] Arrival date & time: 09/19/18  1013     History   Chief Complaint Chief Complaint  Patient presents with  . Cough    HPI Samantha Hardin is a 27 y.o. female.    Cough  Cough characteristics:  Productive Sputum characteristics:  Yellow Severity:  Moderate Duration:  5 days Timing:  Sporadic Progression:  Unchanged Chronicity:  New Smoker: no   Context: sick contacts   Relieved by:  Nothing Worsened by:  Environmental changes Ineffective treatments: mucinex cold and flu. Associated symptoms: chest pain, ear pain, eye discharge, headaches, myalgias, rhinorrhea, sinus congestion and sore throat   Associated symptoms: no chills, no diaphoresis, no ear fullness and no fever     Past Medical History:  Diagnosis Date  . Anemia   . Asthma   . Depression    never on meds, doing ok  . Headache(784.0)    when on birth control  . Heart murmur   . Migraines     Patient Active Problem List   Diagnosis Date Noted  . Uterine contractions during pregnancy 09/14/2016  . Rh negative, antepartum 05/27/2016  . Encounter for supervision of other normal pregnancy in first trimester 02/18/2016    Past Surgical History:  Procedure Laterality Date  . CESAREAN SECTION N/A 09/14/2016   Procedure: CESAREAN SECTION;  Surgeon: Levie Heritage, DO;  Location: [facility] BIRTHING SUITES;  Service: Obstetrics;  Laterality: N/A;  . INDUCED ABORTION    . PERINEAL LACERATION REPAIR N/A 09/14/2016   Procedure: Larene Pickett BALLOON PLACEMENT;  Surgeon: Levie Heritage, DO;  Location: [facility] BIRTHING SUITES;  Service: Obstetrics;  Laterality: N/A;    OB History    Gravida  3   Para  1   Term  1   Preterm  0   AB  2   Living  1     SAB  0   TAB  2   Ectopic  0   Multiple  0   Live Births  1            Home Medications    Prior to Admission medications   Medication Sig Start Date End Date Taking? Authorizing Provider  albuterol  (PROVENTIL HFA;VENTOLIN HFA) 108 (90 Base) MCG/ACT inhaler Inhale 2 puffs into the lungs every 6 (six) hours as needed for wheezing or shortness of breath.    [provider]  cetirizine (ZYRTEC) 10 MG tablet Take 1 tablet (10 mg total) by mouth daily. 09/19/18   Jozlyn Schatz, Gloris Manchester A, NP  dextromethorphan-guaiFENesin (MUCINEX DM) 30-600 MG 12hr tablet Take 1 tablet by mouth 2 (two) times daily. 09/19/18   Dahlia Byes A, NP  ibuprofen (ADVIL,MOTRIN) 600 MG tablet Take 1 tablet (600 mg total) by mouth every 6 (six) hours. 09/17/16   Donette Larry, CNM    Family History Family History  Problem Relation Age of Onset  . Heart disease Maternal Grandfather   . Diabetes Maternal Grandfather   . Hypertension Maternal Grandfather   . Anesthesia problems Neg Hx     Social History Social History   Tobacco Use  . Smoking status: Former Smoker    Packs/day: 0.25    Types: Cigarettes    Last attempt to quit: 10/26/2014    Years since quitting: 3.9  . Smokeless tobacco: Never Used  Substance Use Topics  . Alcohol use: No  . Drug use: No     Allergies   Latex   Review  of Systems Review of Systems  Constitutional: Negative for chills, diaphoresis and fever.  HENT: Positive for ear pain, rhinorrhea and sore throat.   Eyes: Positive for discharge.  Respiratory: Positive for cough.   Cardiovascular: Positive for chest pain.  Musculoskeletal: Positive for myalgias.  Neurological: Positive for headaches.     Physical Exam Triage Vital Signs ED Triage Vitals  Enc Vitals Group     BP 09/19/18 1153 123/71     Pulse Rate 09/19/18 1153 71     Resp 09/19/18 1153 18     Temp 09/19/18 1153 98.3 F (36.8 C)     Temp Source 09/19/18 1153 Oral     SpO2 09/19/18 1153 100 %     Weight 09/19/18 1154 155 lb (70.3 kg)     Height --      Head Circumference --      Peak Flow --      Pain Score 09/19/18 1154 8     Pain Loc --      Pain Edu? --      Excl. in GC? --    No data  found.  Updated Vital Signs BP 123/71 (BP Location: Right Arm)   Pulse 71   Temp 98.3 F (36.8 C) (Oral)   Resp 18   Wt 155 lb (70.3 kg)   LMP 08/26/2018   SpO2 100%   BMI 25.02 kg/m   Visual Acuity Right Eye Distance:   Left Eye Distance:   Bilateral Distance:    Right Eye Near:   Left Eye Near:    Bilateral Near:     Physical Exam  Constitutional: She appears well-developed and well-nourished.  Appears ill   HENT:  Head: Normocephalic.  Right Ear: Hearing, tympanic membrane, external ear and ear canal normal.  Left Ear: Hearing, tympanic membrane, external ear and ear canal normal.  Nose: Mucosal edema, rhinorrhea and sinus tenderness present.  Mouth/Throat: Uvula is midline and mucous membranes are normal. No trismus in the jaw. Posterior oropharyngeal erythema present. No tonsillar abscesses. Tonsils are 1+ on the right. Tonsils are 1+ on the left.  Eyes:  Bilateral scleral injection with tearing.   Cardiovascular: Normal rate, regular rhythm and normal heart sounds.  Pulmonary/Chest: Effort normal and breath sounds normal. No stridor. No respiratory distress. She has no wheezes. She has no rales. She exhibits no tenderness.  Musculoskeletal: Normal range of motion.  Neurological: She is alert.  Skin: Skin is warm and dry. No rash noted. No erythema. No pallor.  Psychiatric: She has a normal mood and affect.  Nursing note and vitals reviewed.    UC Treatments / Results  Labs (all labs ordered are listed, but only abnormal results are displayed) Labs Reviewed  CULTURE, GROUP A STREP [facility])  POCT RAPID STREP A    EKG None  Radiology No results found.  Procedures Procedures (including critical care time)  Medications Ordered in UC Medications - No data to display  Initial Impression / Assessment and Plan / UC Course  I have reviewed the triage vital signs and the nursing notes.  Pertinent labs & imaging results that were available during my care of  the patient were reviewed by me and considered in my medical decision making (see chart for details).     Rapid strep test negative Most likely viral URI Everyone at home has similar illness Symptomatic treatment mucinex DM, zyrtec, and tessalon  Follow up as needed for continued or worsening symptoms  Final Clinical Impressions(s) /  UC Diagnoses   Final diagnoses:  Viral URI with cough     Discharge Instructions      It was nice meeting you!!  Your rapid strep test was negative.  I believe you have a viral upper respiratory infection. It could take 7 to 10 days for the symptoms to decrease or improve.  Zyrtec and mucinex DM for the drainage in your throat and congestion.  Ibuprofen and tylenol can help with the pain.  Make sure that you are staying hydrated.  Follow up as needed for worsening symptoms.      ED Prescriptions    Medication Sig Dispense Auth. Provider   cetirizine (ZYRTEC) 10 MG tablet Take 1 tablet (10 mg total) by mouth daily. 30 tablet Jeziah Kretschmer A, NP   dextromethorphan-guaiFENesin (MUCINEX DM) 30-600 MG 12hr tablet Take 1 tablet by mouth 2 (two) times daily. 15 tablet Dahlia ByesBast, Sione Baumgarten A, NP     Controlled Substance Prescriptions [place] Controlled Substance Registry consulted? Not Applicable   Janace ArisBast, Erick Murin A, NP 09/19/18 1254

## 2018-09-21 LAB — CULTURE, GROUP A STREP (THRC)

## 2019-03-25 ENCOUNTER — Ambulatory Visit (HOSPITAL_COMMUNITY)
Admission: EM | Admit: 2019-03-25 | Discharge: 2019-03-25 | Disposition: A | Discharge status: Home / Self Care | Payer: BLUE CROSS/BLUE SHIELD | Attending: Family Medicine | Admitting: Family Medicine

## 2019-03-25 ENCOUNTER — Encounter (HOSPITAL_COMMUNITY): Payer: Self-pay | Admitting: Family Medicine

## 2019-03-25 ENCOUNTER — Other Ambulatory Visit: Payer: Self-pay

## 2019-03-25 DIAGNOSIS — G44221 Chronic tension-type headache, intractable: Secondary | ICD-10-CM

## 2019-03-25 DIAGNOSIS — R112 Nausea with vomiting, unspecified: Secondary | ICD-10-CM

## 2019-03-25 LAB — POCT PREGNANCY, URINE: Preg Test, Ur: NEGATIVE

## 2019-03-25 MED ORDER — ONDANSETRON 8 MG PO TBDP: 8.0000 mg | ORAL_TABLET | Freq: Three times a day (TID) | ORAL | 0 refills | Status: DC | PRN

## 2019-03-25 MED ORDER — FAMOTIDINE 20 MG PO TABS: 20.0000 mg | ORAL_TABLET | Freq: Two times a day (BID) | ORAL | 0 refills | Status: DC

## 2019-03-25 NOTE — ED Triage Notes (Signed)
Per pt she woke up this morning with nausea vomiting/ possible pregnancy. Pt has no fevers but abdominal cramping

## 2019-03-25 NOTE — ED Provider Notes (Signed)
[facility]    CSN: [id number] Arrival date & time: 03/25/19  1012     History   Chief Complaint Chief Complaint  Patient presents with  . Nausea    HPI Samantha Hardin is a 28 y.o. female.   Per pt she woke up this morning with nausea vomiting/ possible pregnancy. Pt has no fevers but abdominal cramping.  Her last period was recently but was lighter than usual.  Patient has a chronic history of headaches which are bitemporal and occur several times a week.  She is tried ibuprofen but it has not really work well for her.  Patient developed some nausea this morning with 2 episodes of vomiting.  She said no diarrhea.  She has had some mild epigastric discomfort.     Past Medical History:  Diagnosis Date  . Anemia   . Asthma   . Depression    never on meds, doing ok  . Headache(784.0)    when on birth control  . Heart murmur   . Migraines     Patient Active Problem List   Diagnosis Date Noted  . Uterine contractions during pregnancy 09/14/2016  . Rh negative, antepartum 05/27/2016  . Encounter for supervision of other normal pregnancy in first trimester 02/18/2016    Past Surgical History:  Procedure Laterality Date  . CESAREAN SECTION N/A 09/14/2016   Procedure: CESAREAN SECTION;  Surgeon: Levie Heritage, DO;  Location: [facility] BIRTHING SUITES;  Service: Obstetrics;  Laterality: N/A;  . INDUCED ABORTION    . PERINEAL LACERATION REPAIR N/A 09/14/2016   Procedure: Larene Pickett BALLOON PLACEMENT;  Surgeon: Levie Heritage, DO;  Location: [facility] BIRTHING SUITES;  Service: Obstetrics;  Laterality: N/A;    OB History    Gravida  3   Para  1   Term  1   Preterm  0   AB  2   Living  1     SAB  0   TAB  2   Ectopic  0   Multiple  0   Live Births  1            Home Medications    Prior to Admission medications   Medication Sig Start Date End Date Taking? Authorizing Provider  albuterol (PROVENTIL HFA;VENTOLIN HFA) 108 (90 Base) MCG/ACT inhaler  Inhale 2 puffs into the lungs every 6 (six) hours as needed for wheezing or shortness of breath.    [provider]  cetirizine (ZYRTEC) 10 MG tablet Take 1 tablet (10 mg total) by mouth daily. 09/19/18   Dahlia Byes A, NP  famotidine (PEPCID) 20 MG tablet Take 1 tablet (20 mg total) by mouth 2 (two) times daily. 03/25/19   Elvina Sidle, MD  ondansetron (ZOFRAN-ODT) 8 MG disintegrating tablet Take 1 tablet (8 mg total) by mouth every 8 (eight) hours as needed for nausea. 03/25/19   Elvina Sidle, MD    Family History Family History  Problem Relation Age of Onset  . Heart disease Maternal Grandfather   . Diabetes Maternal Grandfather   . Hypertension Maternal Grandfather   . Anesthesia problems Neg Hx     Social History Social History   Tobacco Use  . Smoking status: Former Smoker    Packs/day: 0.25    Types: Cigarettes    Last attempt to quit: 10/26/2014    Years since quitting: 4.4  . Smokeless tobacco: Never Used  Substance Use Topics  . Alcohol use: No  . Drug use: No  Allergies   Latex   Review of Systems Review of Systems  Gastrointestinal: Positive for abdominal pain and vomiting.  Neurological: Positive for headaches.  All other systems reviewed and are negative.    Physical Exam Triage Vital Signs ED Triage Vitals  Enc Vitals Group     BP 03/25/19 1022 118/75     Pulse Rate 03/25/19 1022 64     Resp 03/25/19 1022 16     Temp 03/25/19 1022 98.8 F (37.1 C)     Temp Source 03/25/19 1022 Oral     SpO2 03/25/19 1022 100 %     Weight --      Height --      Head Circumference --      Peak Flow --      Pain Score 03/25/19 1024 5     Pain Loc --      Pain Edu? --      Excl. in GC? --    No data found.  Updated Vital Signs BP 118/75 (BP Location: Right Arm)   Pulse 64   Temp 98.8 F (37.1 C) (Oral)   Resp 16   SpO2 100%    Physical Exam Vitals signs and nursing note reviewed.  Constitutional:      Appearance: Normal  appearance. She is normal weight.  HENT:     Head: Normocephalic.     Mouth/Throat:     Pharynx: Oropharynx is clear.  Eyes:     Conjunctiva/sclera: Conjunctivae normal.  Neck:     Musculoskeletal: Normal range of motion and neck supple.  Cardiovascular:     Rate and Rhythm: Normal rate and regular rhythm.  Pulmonary:     Effort: Pulmonary effort is normal.     Breath sounds: Normal breath sounds.  Abdominal:     General: Bowel sounds are normal.     Tenderness: There is abdominal tenderness.     Comments: Mild epigastric tenderness  Musculoskeletal: Normal range of motion.  Skin:    General: Skin is warm and dry.  Neurological:     General: No focal deficit present.     Mental Status: She is alert.  Psychiatric:        Mood and Affect: Mood normal.      UC Treatments / Results  Labs (all labs ordered are listed, but only abnormal results are displayed) Labs Reviewed  POC URINE PREG, ED  POCT PREGNANCY, URINE    EKG None  Radiology No results found.  Procedures Procedures (including critical care time)  Medications Ordered in UC Medications - No data to display  Initial Impression / Assessment and Plan / UC Course  I have reviewed the triage vital signs and the nursing notes.  Pertinent labs & imaging results that were available during my care of the patient were reviewed by me and considered in my medical decision making (see chart for details).    Final Clinical Impressions(s) / UC Diagnoses   Final diagnoses:  Intractable vomiting with nausea, unspecified vomiting type  Chronic tension-type headache, intractable     Discharge Instructions     Try Excedrin-migraine for the headaches.    ED Prescriptions    Medication Sig Dispense Auth. Provider   ondansetron (ZOFRAN-ODT) 8 MG disintegrating tablet Take 1 tablet (8 mg total) by mouth every 8 (eight) hours as needed for nausea. 12 tablet Elvina SidleLauenstein, Anokhi Shannon, MD   famotidine (PEPCID) 20 MG tablet  Take 1 tablet (20 mg total) by mouth 2 (two) times daily.  30 tablet Elvina Sidle, MD     Controlled Substance Prescriptions [place] Controlled Substance Registry consulted? Not Applicable   Elvina Sidle, MD 03/25/19 1043

## 2019-03-25 NOTE — Discharge Instructions (Signed)
Try Excedrin-migraine for the headaches.

## 2019-09-26 ENCOUNTER — Encounter: Payer: Self-pay | Admitting: Family Medicine

## 2019-09-26 ENCOUNTER — Ambulatory Visit (INDEPENDENT_AMBULATORY_CARE_PROVIDER_SITE_OTHER): Payer: BC Managed Care – PPO | Admitting: Family Medicine

## 2019-09-26 ENCOUNTER — Other Ambulatory Visit: Payer: Self-pay

## 2019-09-26 VITALS — BP 102/60 | HR 77 | Ht 67.01 in | Wt 159.4 lb

## 2019-09-26 DIAGNOSIS — Z0000 Encounter for general adult medical examination without abnormal findings: Secondary | ICD-10-CM | POA: Diagnosis not present

## 2019-09-26 DIAGNOSIS — D500 Iron deficiency anemia secondary to blood loss (chronic): Secondary | ICD-10-CM

## 2019-09-26 DIAGNOSIS — Z7689 Persons encountering health services in other specified circumstances: Secondary | ICD-10-CM

## 2019-09-26 NOTE — Assessment & Plan Note (Addendum)
Declined flu vaccine.  Instructed patient to make follow-up appointment to update Pap smear.  Advised to maintain healthy lifestyle through healthy diet and regular exercise, see AVS for details. Handout provided on birth control options, patient not interested in starting at this time. Follow up in one year.

## 2019-09-26 NOTE — Progress Notes (Signed)
  Subjective:   Patient ID: Samantha Hardin    DOB: [birth year], 28 y.o. female   MRN: [id number]  Samantha Hardin is a 28 y.o. female with a history of migraines, h/o depression, asthma, anemia here for   HPI:   Patient presents today to establish care. Other Concerns: wants to make sure C section incision has healed properly. Denies redness, swelling, or irritation.   PMH: migraines (only when tired, no meds), exercise-induced asthma, anemia     Medications: Albuterol prn, multivitamin   Social History:  Lives with son, boyfriend, boyfriend's sister.  Feels safe at home. Employment: works at [employer]: intermittently. Diet: eats 2 meals per day. No issues getting food.   Smoking: cigarettes at work, 2 per day, on and off for 3 years  Alcohol: no Illicit Drug use: no   Health Maintenance:  Due for Pap smear.  Declined flu vaccine.  FH:  Cancers in family: none known Family history of early heart disease: no   Reproductive History - [id number] - Menses: LMP 09/17/2019 - Contraception: none, not interested - Cancer screening: Due, negative Pap 03/2016 - currently sexually active    Review of Systems:  Per HPI. Denies CP, SPB, abnl vaginal discharge or intermen bleed, N/V. Medications and smoking status reviewed.  Objective:   BP 102/60   Pulse 77   Ht 5' 7.01" (1.702 m)   Wt 159 lb 6 oz (72.3 kg)   LMP 09/19/2019   SpO2 99%   BMI 24.96 kg/m  Vitals and nursing note reviewed.  General: well nourished, well developed, in no acute distress with non-toxic appearance CV: regular rate and rhythm without murmurs, rubs, or gallops, no lower extremity edema Lungs: clear to auscultation bilaterally with normal work of breathing Abdomen: soft, non-tender, non-distended, no masses or organomegaly palpable, normoactive bowel sounds. Skin: warm, dry, no rashes or lesions. Well healed C section incision. Extremities: warm and well perfused, normal tone MSK: ROM grossly  intact, gait normal Neuro: Alert and oriented, speech normal  Assessment & Plan:   Iron deficiency anemia due to chronic blood loss Will recheck CBC today. Continue multivitamin.  Healthcare maintenance Declined flu vaccine.  Instructed patient to make follow-up appointment to update Pap smear.  Advised to maintain healthy lifestyle through healthy diet and regular exercise, see AVS for details. Handout provided on birth control options, patient not interested in starting at this time. Follow up in one year.  Orders Placed This Encounter  Procedures  . CBC   No orders of the defined types were placed in this encounter.   Rory Percy, DO PGY-3, [facility] 09/26/2019 2:56 PM

## 2019-09-26 NOTE — Patient Instructions (Addendum)
It was great to see you!  Our plans for today:  - We are rechecking your blood counts today. We will let you know the results.  - Review the different birth control options and let [place] know if you would like to get on birth control.  - See below for ways to stay healthy.  Take care and seek immediate care sooner if you develop any concerns.   Dr. [facility]  Things to do to keep yourself healthy  - Exercise at least 30-45 minutes a day, 3-4 days a week.  - Eat a low-fat diet with lots of fruits and vegetables, up to 7-9 servings per day.  - Seatbelts can save your life. Wear them always.  - Smoke detectors on every level of your home, check batteries every year.  - Eye Doctor - have an eye exam every 1-2 years  - Safe sex - if you may be exposed to STDs, use a condom.  - Alcohol -  If you drink, do it moderately, less than 2 drinks per day.  - [place]. Choose someone to speak for you if you are not able.  - Depression is common in our stressful world.If you're feeling down or losing interest in things you normally enjoy, please come in for a visit.  - Violence - If anyone is threatening or hurting you, please call immediately.

## 2019-09-26 NOTE — Assessment & Plan Note (Signed)
Will recheck CBC today. Continue multivitamin.

## 2019-09-27 ENCOUNTER — Encounter: Payer: Self-pay | Admitting: Family Medicine

## 2019-09-27 LAB — CBC
Hematocrit: 39.2 % (ref 34.0–46.6)
Hemoglobin: 12.8 g/dL (ref 11.1–15.9)
MCH: 30 pg (ref 26.6–33.0)
MCHC: 32.7 g/dL (ref 31.5–35.7)
MCV: 92 fL (ref 79–97)
Platelets: 207 10*3/uL (ref 150–450)
RBC: 4.27 x10E6/uL (ref 3.77–5.28)
RDW: 12.3 % (ref 11.7–15.4)
WBC: 4.5 10*3/uL (ref 3.4–10.8)

## 2019-12-07 ENCOUNTER — Other Ambulatory Visit: Payer: Self-pay

## 2019-12-07 ENCOUNTER — Ambulatory Visit (HOSPITAL_COMMUNITY)
Admission: EM | Admit: 2019-12-07 | Discharge: 2019-12-07 | Disposition: A | Discharge status: Home / Self Care | Payer: BC Managed Care – PPO | Attending: Internal Medicine | Admitting: Internal Medicine

## 2019-12-07 DIAGNOSIS — Z20822 Contact with and (suspected) exposure to covid-19: Secondary | ICD-10-CM | POA: Diagnosis not present

## 2019-12-07 DIAGNOSIS — R112 Nausea with vomiting, unspecified: Secondary | ICD-10-CM | POA: Insufficient documentation

## 2019-12-07 DIAGNOSIS — J45909 Unspecified asthma, uncomplicated: Secondary | ICD-10-CM | POA: Diagnosis not present

## 2019-12-07 DIAGNOSIS — D500 Iron deficiency anemia secondary to blood loss (chronic): Secondary | ICD-10-CM | POA: Insufficient documentation

## 2019-12-07 DIAGNOSIS — Z87891 Personal history of nicotine dependence: Secondary | ICD-10-CM | POA: Diagnosis not present

## 2019-12-07 DIAGNOSIS — R197 Diarrhea, unspecified: Secondary | ICD-10-CM | POA: Diagnosis not present

## 2019-12-07 DIAGNOSIS — Z79899 Other long term (current) drug therapy: Secondary | ICD-10-CM | POA: Diagnosis not present

## 2019-12-07 DIAGNOSIS — R6883 Chills (without fever): Secondary | ICD-10-CM | POA: Insufficient documentation

## 2019-12-07 DIAGNOSIS — R1084 Generalized abdominal pain: Secondary | ICD-10-CM | POA: Insufficient documentation

## 2019-12-07 DIAGNOSIS — Z3202 Encounter for pregnancy test, result negative: Secondary | ICD-10-CM | POA: Diagnosis not present

## 2019-12-07 LAB — POCT PREGNANCY, URINE: Preg Test, Ur: NEGATIVE

## 2019-12-07 LAB — POC URINE PREG, ED: Preg Test, Ur: NEGATIVE

## 2019-12-07 MED ORDER — ONDANSETRON 4 MG PO TBDP
ORAL_TABLET | ORAL | Status: AC
  Filled 2019-12-07: qty 1

## 2019-12-07 MED ORDER — ONDANSETRON 4 MG PO TBDP
4.0000 mg | ORAL_TABLET | Freq: Once | ORAL | Status: AC
  Administered 2019-12-07: 10:00:00 4 mg via ORAL

## 2019-12-07 MED ORDER — ONDANSETRON 4 MG PO TBDP: 4.0000 mg | ORAL_TABLET | Freq: Three times a day (TID) | ORAL | 0 refills | Status: DC | PRN

## 2019-12-07 NOTE — ED Provider Notes (Signed)
[facility]    CSN: [id number] Arrival date & time: 12/07/19  4765      History   Chief Complaint Chief Complaint  Patient presents with  . Abdominal Pain  . Emesis    HPI Samantha Hardin is a 29 y.o. female with history of asthma comes to urgent care with complains of generalized abdominal pain, nausea, vomiting and diarrhea of 2 days duration. Symptoms started fairly abruptly and has been persistent. Abdominal pain is generalized.  Patient denies any loss of sense of smell or taste.  He admits to having some generalized body aches and chills.  No wheezing.  She has had a headache.  Patient has been exposed to COVID-19 positive coworkers.  HPI  Past Medical History:  Diagnosis Date  . Anemia   . Asthma   . Depression    never on meds, doing ok  . Headache(784.0)    when on birth control  . Heart murmur   . Migraines     Patient Active Problem List   Diagnosis Date Noted  . Iron deficiency anemia due to chronic blood loss 09/26/2019  . Healthcare maintenance 09/26/2019  . Uterine contractions during pregnancy 09/14/2016  . Rh negative, antepartum 05/27/2016    Past Surgical History:  Procedure Laterality Date  . CESAREAN SECTION N/A 09/14/2016   Procedure: CESAREAN SECTION;  Surgeon: Levie Heritage, DO;  Location: [facility] BIRTHING SUITES;  Service: Obstetrics;  Laterality: N/A;  . INDUCED ABORTION    . PERINEAL LACERATION REPAIR N/A 09/14/2016   Procedure: Larene Pickett BALLOON PLACEMENT;  Surgeon: Levie Heritage, DO;  Location: [facility] BIRTHING SUITES;  Service: Obstetrics;  Laterality: N/A;    OB History    Gravida  3   Para  1   Term  1   Preterm  0   AB  2   Living  1     SAB  0   TAB  2   Ectopic  0   Multiple  0   Live Births  1            Home Medications    Prior to Admission medications   Medication Sig Start Date End Date Taking? Authorizing Provider  albuterol (PROVENTIL HFA;VENTOLIN HFA) 108 (90 Base) MCG/ACT inhaler Inhale 2  puffs into the lungs every 6 (six) hours as needed for wheezing or shortness of breath.    [provider]  ondansetron (ZOFRAN ODT) 4 MG disintegrating tablet Take 1 tablet (4 mg total) by mouth every 8 (eight) hours as needed for nausea or vomiting. 12/07/19   Celinda Dethlefs, Britta Mccreedy, MD  cetirizine (ZYRTEC) 10 MG tablet Take 1 tablet (10 mg total) by mouth daily. 09/19/18 12/07/19  Dahlia Byes A, NP  famotidine (PEPCID) 20 MG tablet Take 1 tablet (20 mg total) by mouth 2 (two) times daily. 03/25/19 12/07/19  Elvina Sidle, MD    Family History Family History  Problem Relation Age of Onset  . Heart disease Maternal Grandfather   . Diabetes Maternal Grandfather   . Hypertension Maternal Grandfather   . Anesthesia problems Neg Hx     Social History Social History   Tobacco Use  . Smoking status: Former Smoker    Packs/day: 0.25    Types: Cigarettes    Quit date: 10/26/2014    Years since quitting: 5.1  . Smokeless tobacco: Never Used  Substance Use Topics  . Alcohol use: No  . Drug use: No     Allergies  Latex   Review of Systems Review of Systems  Constitutional: Positive for chills and fatigue. Negative for activity change and fever.  HENT: Negative.   Respiratory: Positive for shortness of breath and wheezing. Negative for cough.   Gastrointestinal: Positive for diarrhea, nausea and vomiting.  Genitourinary: Negative.   Musculoskeletal: Negative for arthralgias.  Skin: Negative for rash and wound.  Neurological: Positive for headaches. Negative for light-headedness.  Psychiatric/Behavioral: Negative for confusion and decreased concentration.     Physical Exam Triage Vital Signs ED Triage Vitals  Enc Vitals Group     BP 12/07/19 0956 116/68     Pulse Rate 12/07/19 0956 63     Resp 12/07/19 0956 18     Temp 12/07/19 0956 98.5 F (36.9 C)     Temp Source 12/07/19 0956 Oral     SpO2 12/07/19 0956 100 %     Weight --      Height --      Head Circumference  --      Peak Flow --      Pain Score 12/07/19 0953 6     Pain Loc --      Pain Edu? --      Excl. in [place]? --    No data found.  Updated Vital Signs BP 116/68 (BP Location: Left Arm)   Pulse 63   Temp 98.5 F (36.9 C) (Oral)   Resp 18   LMP 12/06/2019   SpO2 100%   Visual Acuity Right Eye Distance:   Left Eye Distance:   Bilateral Distance:    Right Eye Near:   Left Eye Near:    Bilateral Near:     Physical Exam Vitals and nursing note reviewed.  Constitutional:      General: She is not in acute distress.    Appearance: She is ill-appearing.  Cardiovascular:     Rate and Rhythm: Normal rate and regular rhythm.     Heart sounds: Normal heart sounds. No murmur. No friction rub.  Pulmonary:     Effort: Pulmonary effort is normal. No respiratory distress.     Breath sounds: No stridor. Wheezing present.  Abdominal:     General: Abdomen is flat. Bowel sounds are normal. There is no distension or abdominal bruit.     Palpations: Abdomen is soft. There is shifting dullness. There is no hepatomegaly or splenomegaly.     Tenderness: There is generalized abdominal tenderness. There is no guarding or rebound.  Skin:    General: Skin is warm.     Capillary Refill: Capillary refill takes less than 2 seconds.  Neurological:     Mental Status: She is alert.      UC Treatments / Results  Labs (all labs ordered are listed, but only abnormal results are displayed) Labs Reviewed  NOVEL CORONAVIRUS, NAA (HOSP ORDER, SEND-OUT TO REF LAB; TAT 18-24 HRS)  POCT PREGNANCY, URINE  POC URINE PREG, ED    EKG   Radiology No results found.  Procedures Procedures (including critical care time)  Medications Ordered in UC Medications  ondansetron (ZOFRAN-ODT) disintegrating tablet 4 mg (4 mg Oral Given 12/07/19 1006)    Initial Impression / Assessment and Plan / UC Course  I have reviewed the triage vital signs and the nursing notes.  Pertinent labs & imaging results that  were available during my care of the patient were reviewed by me and considered in my medical decision making (see chart for details).     1.  Viral gastroenteritis: COVID-19 testing has been sent Zofran ODT as needed for nausea and vomiting Patient is advised to optimize oral fluid intake She is welcome to return to urgent care if her symptoms worsen. Urine pregnancy test is negative. Final Clinical Impressions(s) / UC Diagnoses   Final diagnoses:  Nausea vomiting and diarrhea   Discharge Instructions   None    ED Prescriptions    Medication Sig Dispense Auth. Provider   ondansetron (ZOFRAN ODT) 4 MG disintegrating tablet Take 1 tablet (4 mg total) by mouth every 8 (eight) hours as needed for nausea or vomiting. 20 tablet Analysse Quinonez, Britta Mccreedy, MD     PDMP not reviewed this encounter.   Merrilee Jansky, MD 12/07/19 [phone]

## 2019-12-07 NOTE — ED Triage Notes (Addendum)
Pt c/o ha, abd pain, n/v/d, body aches, chills since yesterday. Denies congestion, sore throat, cough. States has been exposed to COVID positive co-workers. Needs refill of albuterol inhaler.  Last emesis this morning.

## 2019-12-09 ENCOUNTER — Encounter: Payer: Self-pay | Admitting: Family Medicine

## 2019-12-09 LAB — NOVEL CORONAVIRUS, NAA (HOSP ORDER, SEND-OUT TO REF LAB; TAT 18-24 HRS): SARS-CoV-2, NAA: NOT DETECTED

## 2020-02-26 ENCOUNTER — Other Ambulatory Visit: Payer: Self-pay

## 2020-02-26 ENCOUNTER — Encounter (HOSPITAL_COMMUNITY): Payer: Self-pay

## 2020-02-26 ENCOUNTER — Ambulatory Visit (HOSPITAL_COMMUNITY)
Admission: EM | Admit: 2020-02-26 | Discharge: 2020-02-26 | Disposition: A | Discharge status: Home / Self Care | Payer: BC Managed Care – PPO | Attending: Internal Medicine | Admitting: Internal Medicine

## 2020-02-26 DIAGNOSIS — J111 Influenza due to unidentified influenza virus with other respiratory manifestations: Secondary | ICD-10-CM

## 2020-02-26 DIAGNOSIS — Z20822 Contact with and (suspected) exposure to covid-19: Secondary | ICD-10-CM | POA: Insufficient documentation

## 2020-02-26 DIAGNOSIS — R6889 Other general symptoms and signs: Secondary | ICD-10-CM

## 2020-02-26 MED ORDER — BENZONATATE 100 MG PO CAPS: 100.0000 mg | ORAL_CAPSULE | Freq: Three times a day (TID) | ORAL | 0 refills | Status: DC | PRN

## 2020-02-26 NOTE — ED Triage Notes (Signed)
Pt is here with a cough and body aches that started yesterday. Pt has taken Advil to relieve discomfort.

## 2020-02-27 LAB — SARS CORONAVIRUS 2 (TAT 6-24 HRS): SARS Coronavirus 2: POSITIVE — AB

## 2020-02-27 NOTE — ED Provider Notes (Signed)
[place]    CSN: [id number] Arrival date & time: 02/26/20  0900      History   Chief Complaint Chief Complaint  Patient presents with  . Cough  . Generalized Body Aches    HPI Samantha Hardin is a 29 y.o. female comes to the urgent care with a 1 day history of cough, sore throat and generalized body aches.  Patient symptoms started yesterday and has been persistent.  She is taking Advil with no improvement in his symptoms.  Patient denies any nausea or vomiting.  No diarrhea.  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  She denies any shortness of breath, chest pain or chest pressure.  She has generalized body aches.  No loss of taste or smell. HPI  Past Medical History:  Diagnosis Date  . Anemia   . Asthma   . Depression    never on meds, doing ok  . Headache(784.0)    when on birth control  . Heart murmur   . Migraines     Patient Active Problem List   Diagnosis Date Noted  . Iron deficiency anemia due to chronic blood loss 09/26/2019  . Healthcare maintenance 09/26/2019  . Uterine contractions during pregnancy 09/14/2016  . Rh negative, antepartum 05/27/2016    Past Surgical History:  Procedure Laterality Date  . CESAREAN SECTION N/A 09/14/2016   Procedure: CESAREAN SECTION;  Surgeon: Truett Mainland, DO;  Location: [facility];  Service: Obstetrics;  Laterality: N/A;  . INDUCED ABORTION    . PERINEAL LACERATION REPAIR N/A 09/14/2016   Procedure: Adalberto Ill BALLOON PLACEMENT;  Surgeon: Truett Mainland, DO;  Location: [facility];  Service: Obstetrics;  Laterality: N/A;    OB History    Gravida  3   Para  1   Term  1   Preterm  0   AB  2   Living  1     SAB  0   TAB  2   Ectopic  0   Multiple  0   Live Births  1            Home Medications    Prior to Admission medications   Medication Sig Start Date End Date Taking? Authorizing Provider  albuterol  (PROVENTIL HFA;VENTOLIN HFA) 108 (90 Base) MCG/ACT inhaler Inhale 2 puffs into the lungs every 6 (six) hours as needed for wheezing or shortness of breath.    [provider]  benzonatate (TESSALON) 100 MG capsule Take 1 capsule (100 mg total) by mouth 3 (three) times daily as needed for cough. 02/26/20   Chase Picket, MD  ondansetron (ZOFRAN ODT) 4 MG disintegrating tablet Take 1 tablet (4 mg total) by mouth every 8 (eight) hours as needed for nausea or vomiting. 12/07/19   Zaidy Absher, Myrene Galas, MD  cetirizine (ZYRTEC) 10 MG tablet Take 1 tablet (10 mg total) by mouth daily. 09/19/18 12/07/19  Loura Halt A, NP  famotidine (PEPCID) 20 MG tablet Take 1 tablet (20 mg total) by mouth 2 (two) times daily. 03/25/19 12/07/19  Robyn Haber, MD    Family History Family History  Problem Relation Age of Onset  . Heart disease Maternal Grandfather   . Diabetes Maternal Grandfather   . Hypertension Maternal Grandfather   . Healthy Mother   . Healthy Father   . Anesthesia problems Neg Hx     Social History Social History   Tobacco  Use  . Smoking status: Former Smoker    Packs/day: 0.25    Types: Cigarettes    Quit date: 10/26/2014    Years since quitting: 5.3  . Smokeless tobacco: Never Used  Substance Use Topics  . Alcohol use: No  . Drug use: No     Allergies   No known allergies and Latex   Review of Systems Review of Systems  Constitutional: Positive for fatigue. Negative for appetite change and fever.  HENT: Positive for congestion.   Respiratory: Positive for cough.   Cardiovascular: Positive for chest pain.  Gastrointestinal: Negative for abdominal pain, diarrhea, nausea and vomiting.  Genitourinary: Negative.   Musculoskeletal: Positive for arthralgias and myalgias. Negative for joint swelling.  Neurological: Negative.      Physical Exam Triage Vital Signs ED Triage Vitals  Enc Vitals Group     BP 02/26/20 0919 111/68     Pulse Rate 02/26/20 0919 83      Resp 02/26/20 0919 16     Temp 02/26/20 0919 99.2 F (37.3 C)     Temp Source 02/26/20 0919 Oral     SpO2 02/26/20 0919 100 %     Weight 02/26/20 0924 159 lb 3.2 oz (72.2 kg)     Height --      Head Circumference --      Peak Flow --      Pain Score 02/26/20 0918 0     Pain Loc --      Pain Edu? --      Excl. in [place]? --    No data found.  Updated Vital Signs BP 111/68 (BP Location: Left Arm)   Pulse 83   Temp 99.2 F (37.3 C) (Oral)   Resp 16   Wt 72.2 kg   LMP 02/17/2020   SpO2 100%   Breastfeeding No   BMI 24.93 kg/m   Visual Acuity Right Eye Distance:   Left Eye Distance:   Bilateral Distance:    Right Eye Near:   Left Eye Near:    Bilateral Near:     Physical Exam Vitals and nursing note reviewed.  Constitutional:      General: She is not in acute distress.    Appearance: She is not ill-appearing.  HENT:     Right Ear: Tympanic membrane normal.     Left Ear: Tympanic membrane normal.  Cardiovascular:     Rate and Rhythm: Normal rate and regular rhythm.     Pulses: Normal pulses.     Heart sounds: Normal heart sounds.  Pulmonary:     Effort: Pulmonary effort is normal. No respiratory distress.     Breath sounds: Normal breath sounds. No stridor.  Musculoskeletal:        General: Normal range of motion.  Lymphadenopathy:     Cervical: No cervical adenopathy.  Skin:    Capillary Refill: Capillary refill takes less than 2 seconds.  Neurological:     Mental Status: She is alert.      UC Treatments / Results  Labs (all labs ordered are listed, but only abnormal results are displayed) Labs Reviewed  SARS CORONAVIRUS 2 (TAT 6-24 HRS) - Abnormal; Notable for the following components:      Result Value   SARS Coronavirus 2 POSITIVE (*)    All other components within normal limits    EKG   Radiology No results found.  Procedures Procedures (including critical care time)  Medications Ordered in UC Medications - No data to display  Initial  Impression / Assessment and Plan / UC Course  I have reviewed the triage vital signs and the nursing notes.  Pertinent labs & imaging results that were available during my care of the patient were reviewed by me and considered in my medical decision making (see chart for details).     1. Flu-like illness: COVID-19 PCR testing Tessalon Perles as needed for cough Patient is advised to quarantine with her family Given the COVID-19 test comes up positive we will call the patient with further guidance Patient is advised to hydrate Tylenol/Advil as needed for pain or fever. Return precautions given-shortness of breath, altered mentation, persistent vomiting Final Clinical Impressions(s) / UC Diagnoses   Final diagnoses:  Flu-like symptoms   Discharge Instructions   None    ED Prescriptions    Medication Sig Dispense Auth. Provider   benzonatate (TESSALON) 100 MG capsule Take 1 capsule (100 mg total) by mouth 3 (three) times daily as needed for cough. 30 capsule Marieanne Marxen, Britta Mccreedy, MD     PDMP not reviewed this encounter.   Merrilee Jansky, MD 02/27/20 2202

## 2020-07-01 ENCOUNTER — Other Ambulatory Visit: Payer: Self-pay

## 2020-07-01 ENCOUNTER — Ambulatory Visit (HOSPITAL_COMMUNITY)
Admission: EM | Admit: 2020-07-01 | Discharge: 2020-07-01 | Disposition: A | Discharge status: Home / Self Care | Payer: BC Managed Care – PPO | Attending: Family Medicine | Admitting: Family Medicine

## 2020-07-01 ENCOUNTER — Encounter (HOSPITAL_COMMUNITY): Payer: Self-pay

## 2020-07-01 DIAGNOSIS — R52 Pain, unspecified: Secondary | ICD-10-CM

## 2020-07-01 DIAGNOSIS — Z20822 Contact with and (suspected) exposure to covid-19: Secondary | ICD-10-CM | POA: Diagnosis not present

## 2020-07-01 DIAGNOSIS — R0781 Pleurodynia: Secondary | ICD-10-CM | POA: Insufficient documentation

## 2020-07-01 LAB — SARS CORONAVIRUS 2 (TAT 6-24 HRS): SARS Coronavirus 2: NEGATIVE

## 2020-07-01 MED ORDER — CYCLOBENZAPRINE HCL 5 MG PO TABS: 5.0000 mg | ORAL_TABLET | Freq: Three times a day (TID) | ORAL | 0 refills | Status: DC | PRN

## 2020-07-01 NOTE — ED Provider Notes (Signed)
[facility]    CSN: [id number] Arrival date & time: 07/01/20  0831      History   Chief Complaint Chief Complaint  Patient presents with  . Breast Pain    HPI Samantha Hardin is a 29 y.o. female.   About 2-3 days of diffuse left sided body aches worst in chest/breast area. States sharp pain with bending over in the left rib area noted, otherwise just feeling achy and fatigued overall. Taking OTC pain relievers with mild temporary relief. Does endorse lots of heavy lifting and bending at her job which she thinks could be causing some of her issues, and notes sxs began after receiving her second COVID vaccine 3 days ago. Denies SOB, wheezing, cough, congestion, sore throat, abdominal pain, N/V/D.      Past Medical History:  Diagnosis Date  . Anemia   . Asthma   . Depression    never on meds, doing ok  . Headache(784.0)    when on birth control  . Heart murmur   . Migraines     Patient Active Problem List   Diagnosis Date Noted  . Iron deficiency anemia due to chronic blood loss 09/26/2019  . Healthcare maintenance 09/26/2019  . Uterine contractions during pregnancy 09/14/2016  . Rh negative, antepartum 05/27/2016    Past Surgical History:  Procedure Laterality Date  . CESAREAN SECTION N/A 09/14/2016   Procedure: CESAREAN SECTION;  Surgeon: Levie Heritage, DO;  Location: [facility] BIRTHING SUITES;  Service: Obstetrics;  Laterality: N/A;  . INDUCED ABORTION    . PERINEAL LACERATION REPAIR N/A 09/14/2016   Procedure: Larene Pickett BALLOON PLACEMENT;  Surgeon: Levie Heritage, DO;  Location: [facility] BIRTHING SUITES;  Service: Obstetrics;  Laterality: N/A;    OB History    Gravida  3   Para  1   Term  1   Preterm  0   AB  2   Living  1     SAB  0   TAB  2   Ectopic  0   Multiple  0   Live Births  1            Home Medications    Prior to Admission medications   Medication Sig Start Date End Date Taking? Authorizing Provider  albuterol (PROVENTIL  HFA;VENTOLIN HFA) 108 (90 Base) MCG/ACT inhaler Inhale 2 puffs into the lungs every 6 (six) hours as needed for wheezing or shortness of breath.    [provider]  benzonatate (TESSALON) 100 MG capsule Take 1 capsule (100 mg total) by mouth 3 (three) times daily as needed for cough. 02/26/20   Lamptey, Britta Mccreedy, MD  cyclobenzaprine (FLEXERIL) 5 MG tablet Take 1 tablet (5 mg total) by mouth 3 (three) times daily as needed for muscle spasms. 07/01/20   Particia Nearing, PA-C  ondansetron (ZOFRAN ODT) 4 MG disintegrating tablet Take 1 tablet (4 mg total) by mouth every 8 (eight) hours as needed for nausea or vomiting. 12/07/19   Lamptey, Britta Mccreedy, MD  cetirizine (ZYRTEC) 10 MG tablet Take 1 tablet (10 mg total) by mouth daily. 09/19/18 12/07/19  Dahlia Byes A, NP  famotidine (PEPCID) 20 MG tablet Take 1 tablet (20 mg total) by mouth 2 (two) times daily. 03/25/19 12/07/19  Elvina Sidle, MD    Family History Family History  Problem Relation Age of Onset  . Heart disease Maternal Grandfather   . Diabetes Maternal Grandfather   . Hypertension Maternal Grandfather   . Healthy  Mother   . Healthy Father   . Anesthesia problems Neg Hx     Social History Social History   Tobacco Use  . Smoking status: Former Smoker    Packs/day: 0.25    Types: Cigarettes    Quit date: 10/26/2014    Years since quitting: 5.6  . Smokeless tobacco: Never Used  Substance Use Topics  . Alcohol use: No  . Drug use: No     Allergies   No known allergies and Latex   Review of Systems Review of Systems PER HPI   Physical Exam Triage Vital Signs ED Triage Vitals  Enc Vitals Group     BP 07/01/20 0946 103/65     Pulse Rate 07/01/20 0946 63     Resp --      Temp 07/01/20 0946 98.5 F (36.9 C)     Temp Source 07/01/20 0946 Oral     SpO2 07/01/20 0946 100 %     Weight --      Height --      Head Circumference --      Peak Flow --      Pain Score 07/01/20 0944 7     Pain Loc --      Pain  Edu? --      Excl. in [place]? --    No data found.  Updated Vital Signs BP 103/65 (BP Location: Left Arm)   Pulse 63   Temp 98.5 F (36.9 C) (Oral)   LMP  (Within Days)   SpO2 100%   Visual Acuity Right Eye Distance:   Left Eye Distance:   Bilateral Distance:    Right Eye Near:   Left Eye Near:    Bilateral Near:     Physical Exam Vitals and nursing note reviewed.  Constitutional:      Appearance: Normal appearance. She is not ill-appearing.  HENT:     Head: Atraumatic.     Right Ear: Tympanic membrane normal.     Left Ear: Tympanic membrane normal.     Nose: Nose normal. No congestion.     Mouth/Throat:     Mouth: Mucous membranes are moist.     Pharynx: Oropharynx is clear.  Eyes:     Extraocular Movements: Extraocular movements intact.     Conjunctiva/sclera: Conjunctivae normal.  Cardiovascular:     Rate and Rhythm: Normal rate and regular rhythm.     Pulses: Normal pulses.     Heart sounds: Normal heart sounds.  Pulmonary:     Effort: Pulmonary effort is normal.     Breath sounds: Normal breath sounds. No wheezing or rales.  Abdominal:     General: Bowel sounds are normal. There is no distension.     Palpations: Abdomen is soft.     Tenderness: There is no abdominal tenderness. There is no right CVA tenderness, left CVA tenderness or guarding.  Musculoskeletal:        General: Tenderness (moderate left anterior rib ttp diffusely across ribs, no point tenderness) present. No swelling, deformity or signs of injury. Normal range of motion.     Cervical back: Normal range of motion and neck supple.  Skin:    General: Skin is warm and dry.     Findings: No bruising or erythema.  Neurological:     Mental Status: She is alert and oriented to person, place, and time.  Psychiatric:        Mood and Affect: Mood normal.  Thought Content: Thought content normal.        Judgment: Judgment normal.      UC Treatments / Results  Labs (all labs ordered are  listed, but only abnormal results are displayed) Labs Reviewed  SARS CORONAVIRUS 2 (TAT 6-24 HRS)    EKG   Radiology No results found.  Procedures Procedures (including critical care time)  Medications Ordered in UC Medications - No data to display  Initial Impression / Assessment and Plan / UC Course  I have reviewed the triage vital signs and the nursing notes.  Pertinent labs & imaging results that were available during my care of the patient were reviewed by me and considered in my medical decision making (see chart for details).     Strong suspicion for musculoskeletal pain regarding her acute left rib pain, likely from lifting at work. Declines CXR and EKG today. Tx with heat, rest, lidocaine patches, flexeril prn (precautions reviewed). Will also test for COVID given her vague viral syndrome sxs of body aches and fatigue, though may likely be related to her 2nd COVID vaccine dose several days ago. Letter for work given while awaiting test results, testing protocol reviewed with patient. Supportive symptomatic care and return precautions reviewed.   Final Clinical Impressions(s) / UC Diagnoses   Final diagnoses:  Generalized body aches  Rib pain on left side   Discharge Instructions   None    ED Prescriptions    Medication Sig Dispense Auth. Provider   cyclobenzaprine (FLEXERIL) 5 MG tablet Take 1 tablet (5 mg total) by mouth 3 (three) times daily as needed for muscle spasms. 30 tablet Particia Nearing, [place]     PDMP not reviewed this encounter.   Particia Nearing, [place] 07/01/20 1044

## 2020-07-01 NOTE — ED Triage Notes (Signed)
Pt presents with pain under left breast when bending over. States the pain started 2 days ago after the 2nd shot of the COVID vaccine.

## 2020-08-16 ENCOUNTER — Emergency Department (HOSPITAL_COMMUNITY): Payer: BC Managed Care – PPO

## 2020-08-16 ENCOUNTER — Emergency Department (HOSPITAL_COMMUNITY)
Admission: EM | Admit: 2020-08-16 | Discharge: 2020-08-16 | Disposition: A | Discharge status: Home / Self Care | Payer: BC Managed Care – PPO | Attending: Emergency Medicine | Admitting: Emergency Medicine

## 2020-08-16 ENCOUNTER — Encounter (HOSPITAL_COMMUNITY): Payer: Self-pay

## 2020-08-16 ENCOUNTER — Other Ambulatory Visit: Payer: Self-pay

## 2020-08-16 DIAGNOSIS — Z87891 Personal history of nicotine dependence: Secondary | ICD-10-CM | POA: Diagnosis not present

## 2020-08-16 DIAGNOSIS — R5381 Other malaise: Secondary | ICD-10-CM | POA: Diagnosis not present

## 2020-08-16 DIAGNOSIS — J45909 Unspecified asthma, uncomplicated: Secondary | ICD-10-CM | POA: Diagnosis not present

## 2020-08-16 DIAGNOSIS — Z20822 Contact with and (suspected) exposure to covid-19: Secondary | ICD-10-CM | POA: Diagnosis not present

## 2020-08-16 DIAGNOSIS — R0602 Shortness of breath: Secondary | ICD-10-CM | POA: Insufficient documentation

## 2020-08-16 LAB — BASIC METABOLIC PANEL
Anion gap: 10 (ref 5–15)
BUN: 13 mg/dL (ref 6–20)
CO2: 24 mmol/L (ref 22–32)
Calcium: 9.2 mg/dL (ref 8.9–10.3)
Chloride: 103 mmol/L (ref 98–111)
Creatinine, Ser: 0.71 mg/dL (ref 0.44–1.00)
GFR, Estimated: >60 mL/min (ref >60)
Glucose, Bld: 98 mg/dL (ref 70–99)
Potassium: 3.3 mmol/L — ABNORMAL LOW (ref 3.5–5.1)
Sodium: 137 mmol/L (ref 135–145)

## 2020-08-16 LAB — RESPIRATORY PANEL BY RT PCR (FLU A&B, COVID)
Influenza A by PCR: NEGATIVE
Influenza B by PCR: NEGATIVE
SARS Coronavirus 2 by RT PCR: NEGATIVE

## 2020-08-16 LAB — CBC
HCT: 40.5 % (ref 36.0–46.0)
Hemoglobin: 13.2 g/dL (ref 12.0–15.0)
MCH: 29.9 pg (ref 26.0–34.0)
MCHC: 32.6 g/dL (ref 30.0–36.0)
MCV: 91.6 fL (ref 80.0–100.0)
Platelets: 195 10*3/uL (ref 150–400)
RBC: 4.42 MIL/uL (ref 3.87–5.11)
RDW: 12.4 % (ref 11.5–15.5)
WBC: 4.1 10*3/uL (ref 4.0–10.5)
nRBC: 0 % (ref 0.0–0.2)

## 2020-08-16 MED ORDER — ACETAMINOPHEN 325 MG PO TABS
650.0000 mg | ORAL_TABLET | Freq: Once | ORAL | Status: AC
  Administered 2020-08-16: 650 mg via ORAL
  Filled 2020-08-16: qty 2

## 2020-08-16 MED ORDER — ALBUTEROL SULFATE HFA 108 (90 BASE) MCG/ACT IN AERS: 2.0000 | INHALATION_SPRAY | Freq: Four times a day (QID) | RESPIRATORY_TRACT | 1 refills | Status: DC | PRN

## 2020-08-16 NOTE — ED Provider Notes (Signed)
[facility] EMERGENCY DEPARTMENT Provider Note   CSN: [id number] Arrival date & time: 08/16/20  3500     History Chief Complaint  Patient presents with  . Shortness of Breath    Samantha Hardin is a 29 y.o. female.  Patient with history of Covid infection, previously vaccinated for Covid --presents to emergency department for evaluation of shortness of breath, malaise, headache.  Patient states that she developed a headache yesterday.  Today when she was at work she felt generally weak and tired and short of breath.  No chest pain.  No lightheadedness or syncope.  No fever, ear pain, runny nose, sore throat or cough.  No urinary symptoms or skin rash.  She was mainly concerned that she was exposed and developing a breakthrough infection to Covid.  No known sick contacts. Patient denies risk factors for pulmonary embolism including: unilateral leg swelling, history of DVT/PE/other blood clots, use of exogenous hormones, recent immobilizations, recent surgery, recent travel (>4hr segment), malignancy, hemoptysis.          Past Medical History:  Diagnosis Date  . Anemia   . Asthma   . Depression    never on meds, doing ok  . Headache(784.0)    when on birth control  . Heart murmur   . Migraines     Patient Active Problem List   Diagnosis Date Noted  . Iron deficiency anemia due to chronic blood loss 09/26/2019  . Healthcare maintenance 09/26/2019  . Uterine contractions during pregnancy 09/14/2016  . Rh negative, antepartum 05/27/2016    Past Surgical History:  Procedure Laterality Date  . CESAREAN SECTION N/A 09/14/2016   Procedure: CESAREAN SECTION;  Surgeon: Levie Heritage, DO;  Location: [facility] BIRTHING SUITES;  Service: Obstetrics;  Laterality: N/A;  . INDUCED ABORTION    . PERINEAL LACERATION REPAIR N/A 09/14/2016   Procedure: Larene Pickett BALLOON PLACEMENT;  Surgeon: Levie Heritage, DO;  Location: [facility] BIRTHING SUITES;  Service: Obstetrics;  Laterality:  N/A;     OB History    Gravida  3   Para  1   Term  1   Preterm  0   AB  2   Living  1     SAB  0   TAB  2   Ectopic  0   Multiple  0   Live Births  1           Family History  Problem Relation Age of Onset  . Heart disease Maternal Grandfather   . Diabetes Maternal Grandfather   . Hypertension Maternal Grandfather   . Healthy Mother   . Healthy Father   . Anesthesia problems Neg Hx     Social History   Tobacco Use  . Smoking status: Former Smoker    Packs/day: 0.25    Types: Cigarettes    Quit date: 10/26/2014    Years since quitting: 5.8  . Smokeless tobacco: Never Used  Substance Use Topics  . Alcohol use: No  . Drug use: No    Home Medications Prior to Admission medications   Medication Sig Start Date End Date Taking? Authorizing Provider  albuterol (PROVENTIL HFA;VENTOLIN HFA) 108 (90 Base) MCG/ACT inhaler Inhale 2 puffs into the lungs every 6 (six) hours as needed for wheezing or shortness of breath.    [provider]  benzonatate (TESSALON) 100 MG capsule Take 1 capsule (100 mg total) by mouth 3 (three) times daily as needed for cough. 02/26/20   Demaris Callander  O, MD  cyclobenzaprine (FLEXERIL) 5 MG tablet Take 1 tablet (5 mg total) by mouth 3 (three) times daily as needed for muscle spasms. 07/01/20   Particia Nearing, PA-C  ondansetron (ZOFRAN ODT) 4 MG disintegrating tablet Take 1 tablet (4 mg total) by mouth every 8 (eight) hours as needed for nausea or vomiting. 12/07/19   Lamptey, Britta Mccreedy, MD  cetirizine (ZYRTEC) 10 MG tablet Take 1 tablet (10 mg total) by mouth daily. 09/19/18 12/07/19  Dahlia Byes A, NP  famotidine (PEPCID) 20 MG tablet Take 1 tablet (20 mg total) by mouth 2 (two) times daily. 03/25/19 12/07/19  Elvina Sidle, MD    Allergies    No known allergies and Latex  Review of Systems   Review of Systems  Constitutional: Positive for fatigue. Negative for fever.  HENT: Negative for rhinorrhea and sore throat.     Eyes: Negative for redness.  Respiratory: Positive for shortness of breath. Negative for cough.   Cardiovascular: Negative for chest pain.  Gastrointestinal: Negative for abdominal pain, diarrhea, nausea and vomiting.  Genitourinary: Negative for dysuria, frequency, hematuria and urgency.  Musculoskeletal: Negative for myalgias.  Skin: Negative for rash.  Neurological: Positive for headaches.    Physical Exam Updated Vital Signs BP 128/71   Pulse 64   Temp 99.3 F (37.4 C) (Oral)   Resp 15   SpO2 100%   Physical Exam Vitals and nursing note reviewed.  Constitutional:      Appearance: She is well-developed.  HENT:     Head: Normocephalic and atraumatic.     Jaw: No trismus.     Right Ear: Tympanic membrane, ear canal and external ear normal.     Left Ear: Tympanic membrane, ear canal and external ear normal.     Nose: Nose normal. No mucosal edema or rhinorrhea.     Mouth/Throat:     Mouth: Mucous membranes are not dry. No oral lesions.     Pharynx: Uvula midline. No oropharyngeal exudate, posterior oropharyngeal erythema or uvula swelling.     Tonsils: No tonsillar abscesses.  Eyes:     General:        Right eye: No discharge.        Left eye: No discharge.     Conjunctiva/sclera: Conjunctivae normal.  Cardiovascular:     Rate and Rhythm: Normal rate and regular rhythm.     Heart sounds: Normal heart sounds.  Pulmonary:     Effort: Pulmonary effort is normal. No respiratory distress.     Breath sounds: Normal breath sounds. No wheezing or rales.  Abdominal:     Palpations: Abdomen is soft.     Tenderness: There is no abdominal tenderness.  Musculoskeletal:     Cervical back: Normal range of motion and neck supple.  Lymphadenopathy:     Cervical: No cervical adenopathy.  Skin:    General: Skin is warm and dry.  Neurological:     Mental Status: She is alert.     ED Results / Procedures / Treatments   Labs (all labs ordered are listed, but only abnormal  results are displayed) Labs Reviewed  BASIC METABOLIC PANEL - Abnormal; Notable for the following components:      Result Value   Potassium 3.3 (*)    All other components within normal limits  RESPIRATORY PANEL BY RT PCR (FLU A&B, COVID)  CBC    ED ECG REPORT   Date: 08/16/2020  Rate: 80  Rhythm: normal sinus rhythm  QRS Axis:  normal  Intervals: normal  ST/T Wave abnormalities: nonspecific T wave changes  Conduction Disutrbances:none  Narrative Interpretation:   Old EKG Reviewed: unchanged  I have personally reviewed the EKG tracing and agree with the computerized printout as noted.  Radiology DG Chest 2 View  Result Date: 08/16/2020 CLINICAL DATA:  Shortness of breath EXAM: CHEST - 2 VIEW COMPARISON:  02/09/2014 FINDINGS: The heart size and mediastinal contours are within normal limits. Both lungs are clear. The visualized skeletal structures are unremarkable. IMPRESSION: No active cardiopulmonary disease. Electronically Signed   By: Burman Nieves M.D.   On: 08/16/2020 06:48    Procedures Procedures (including critical care time)  Medications Ordered in ED Medications  acetaminophen (TYLENOL) tablet 650 mg (650 mg Oral Given 08/16/20 1322)    ED Course  I have reviewed the triage vital signs and the nursing notes.  Pertinent labs & imaging results that were available during my care of the patient were reviewed by me and considered in my medical decision making (see chart for details).  Patient seen and examined.  Patient's exam and history is reassuring.  Work-up reviewed and is essentially negative.  EKG unchanged.  Patient requesting medication for headache.  Will discharge to home with conservative measures.  Encouraged return to the emergency department for further evaluation if she develops chest pain or worsening shortness of breath.  Vital signs reviewed and are as follows: BP 128/71   Pulse 64   Temp 99.3 F (37.4 C) (Oral)   Resp 15   SpO2 100%       MDM Rules/Calculators/A&P                          Patient with headache, malaise, subjective shortness of breath.  From a respiratory standpoint, work-up is reassuring.  Chest x-ray is clear.  Lungs are clear on exam.  EKG unchanged.  She is not tachycardic or hypoxic.  Patient was concerned about Covid and her testing today was negative.  It is possible that she has a viral infection.  She looks well and nontoxic.  I am not concerned for PE or ACS at this time.  Return instructions as above.   Final Clinical Impression(s) / ED Diagnoses Final diagnoses:  Shortness of breath  Malaise    Rx / DC Orders ED Discharge Orders    None       Renne Crigler, PA-C 08/16/20 1340    Terald Sleeper, MD 08/16/20 1902

## 2020-08-16 NOTE — Discharge Instructions (Signed)
Please read and follow all provided instructions.  Your diagnoses today include:  1. Shortness of breath   2. Malaise     Tests performed today include:  Covid and flu test -was negative  Blood counts and electrolytes -no concerning problems  Chest x-ray -no pneumonia or other problems  Vital signs. See below for your results today.   Medications prescribed:   None  Take any prescribed medications only as directed.  Home care instructions:  Follow any educational materials contained in this packet.  BE VERY CAREFUL not to take multiple medicines containing Tylenol (also called acetaminophen). Doing so can lead to an overdose which can damage your liver and cause liver failure and possibly death.   Follow-up instructions: Please follow-up with your primary care provider in the next 3 days for further evaluation of your symptoms.   Return instructions:   Please return to the Emergency Department if you experience worsening symptoms.   Please return if you have any other emergent concerns.  Additional Information:  Your vital signs today were: BP 128/71   Pulse 64   Temp 99.3 F (37.4 C) (Oral)   Resp 15   SpO2 100%  If your blood pressure (BP) was elevated above 135/85 this visit, please have this repeated by your doctor within one month. --------------

## 2020-08-16 NOTE — ED Triage Notes (Signed)
Pt reports cough, fever, chills. SOB and headache that has been going on for two days. Had covid in May and has since been vaccinated.

## 2020-08-16 NOTE — ED Notes (Signed)
Pt d/c home per MD order. Discharge summary reviewed, pt verbalizes understanding. No s/s of acute discharge noted . Ambulatory off unit with visitor.

## 2020-11-01 ENCOUNTER — Ambulatory Visit: Payer: BC Managed Care – PPO | Admitting: Family Medicine

## 2020-11-14 ENCOUNTER — Ambulatory Visit (HOSPITAL_COMMUNITY)
Admission: EM | Admit: 2020-11-14 | Discharge: 2020-11-14 | Disposition: A | Discharge status: Home / Self Care | Payer: BC Managed Care – PPO | Attending: Family Medicine | Admitting: Family Medicine

## 2020-11-14 ENCOUNTER — Other Ambulatory Visit: Payer: Self-pay

## 2020-11-14 ENCOUNTER — Encounter (HOSPITAL_COMMUNITY): Payer: Self-pay

## 2020-11-14 DIAGNOSIS — J069 Acute upper respiratory infection, unspecified: Secondary | ICD-10-CM | POA: Diagnosis present

## 2020-11-14 DIAGNOSIS — Z79899 Other long term (current) drug therapy: Secondary | ICD-10-CM | POA: Diagnosis not present

## 2020-11-14 DIAGNOSIS — Z20822 Contact with and (suspected) exposure to covid-19: Secondary | ICD-10-CM | POA: Diagnosis not present

## 2020-11-14 DIAGNOSIS — Z87891 Personal history of nicotine dependence: Secondary | ICD-10-CM | POA: Diagnosis not present

## 2020-11-14 DIAGNOSIS — J45909 Unspecified asthma, uncomplicated: Secondary | ICD-10-CM | POA: Diagnosis not present

## 2020-11-14 LAB — SARS CORONAVIRUS 2 (TAT 6-24 HRS): SARS Coronavirus 2: NEGATIVE

## 2020-11-14 MED ORDER — ONDANSETRON 4 MG PO TBDP: 4.0000 mg | ORAL_TABLET | Freq: Three times a day (TID) | ORAL | 0 refills | Status: DC | PRN

## 2020-11-14 MED ORDER — PROMETHAZINE-DM 6.25-15 MG/5ML PO SYRP: 5.0000 mL | ORAL_SOLUTION | Freq: Four times a day (QID) | ORAL | 0 refills | Status: DC | PRN

## 2020-11-14 NOTE — ED Triage Notes (Signed)
Pt presents with ongoing non productive cough, headache, generalized body aches, and diarrhea since yesterday.

## 2020-11-14 NOTE — ED Provider Notes (Signed)
[facility]    CSN: [id number] Arrival date & time: 11/14/20  6433      History   Chief Complaint Chief Complaint  Patient presents with  . Cough  . Generalized Body Aches  . Headache    HPI Samantha Hardin is a 30 y.o. female.   Here today with 1 day history of dry cough, headache, generalized body aches, diarrhea. Denies known fever, abdominal pain, CP, SOB. Not trying anything OTC for sxs at this time other than motrin. No known sick contacts. Hx of asthma, has albuterol inhaler at home.       Past Medical History:  Diagnosis Date  . Anemia   . Asthma   . Depression    never on meds, doing ok  . Headache(784.0)    when on birth control  . Heart murmur   . Migraines     Patient Active Problem List   Diagnosis Date Noted  . Iron deficiency anemia due to chronic blood loss 09/26/2019  . Healthcare maintenance 09/26/2019  . Uterine contractions during pregnancy 09/14/2016  . Rh negative, antepartum 05/27/2016    Past Surgical History:  Procedure Laterality Date  . CESAREAN SECTION N/A 09/14/2016   Procedure: CESAREAN SECTION;  Surgeon: Levie Heritage, DO;  Location: [facility] BIRTHING SUITES;  Service: Obstetrics;  Laterality: N/A;  . INDUCED ABORTION    . PERINEAL LACERATION REPAIR N/A 09/14/2016   Procedure: Larene Pickett BALLOON PLACEMENT;  Surgeon: Levie Heritage, DO;  Location: [facility] BIRTHING SUITES;  Service: Obstetrics;  Laterality: N/A;    OB History    Gravida  3   Para  1   Term  1   Preterm  0   AB  2   Living  1     SAB  0   IAB  2   Ectopic  0   Multiple  0   Live Births  1            Home Medications    Prior to Admission medications   Medication Sig Start Date End Date Taking? Authorizing Provider  promethazine-dextromethorphan (PROMETHAZINE-DM) 6.25-15 MG/5ML syrup Take 5 mLs by mouth 4 (four) times daily as needed for cough. 11/14/20  Yes Particia Nearing, PA-C  albuterol (VENTOLIN HFA) 108 (90 Base) MCG/ACT  inhaler Inhale 2 puffs into the lungs every 6 (six) hours as needed for wheezing or shortness of breath. 08/16/20   Renne Crigler, PA-C  benzonatate (TESSALON) 100 MG capsule Take 1 capsule (100 mg total) by mouth 3 (three) times daily as needed for cough. 02/26/20   Lamptey, Britta Mccreedy, MD  cyclobenzaprine (FLEXERIL) 5 MG tablet Take 1 tablet (5 mg total) by mouth 3 (three) times daily as needed for muscle spasms. 07/01/20   Particia Nearing, PA-C  ondansetron (ZOFRAN ODT) 4 MG disintegrating tablet Take 1 tablet (4 mg total) by mouth every 8 (eight) hours as needed for nausea or vomiting. 11/14/20   Particia Nearing, PA-C  cetirizine (ZYRTEC) 10 MG tablet Take 1 tablet (10 mg total) by mouth daily. 09/19/18 12/07/19  Dahlia Byes A, NP  famotidine (PEPCID) 20 MG tablet Take 1 tablet (20 mg total) by mouth 2 (two) times daily. 03/25/19 12/07/19  Elvina Sidle, MD    Family History Family History  Problem Relation Age of Onset  . Heart disease Maternal Grandfather   . Diabetes Maternal Grandfather   . Hypertension Maternal Grandfather   . Healthy Mother   . Healthy Father   .  Anesthesia problems Neg Hx     Social History Social History   Tobacco Use  . Smoking status: Former Smoker    Packs/day: 0.25    Types: Cigarettes    Quit date: 10/26/2014    Years since quitting: 6.0  . Smokeless tobacco: Never Used  Substance Use Topics  . Alcohol use: No  . Drug use: No     Allergies   Latex   Review of Systems Review of Systems PER HPI   Physical Exam Triage Vital Signs ED Triage Vitals  Enc Vitals Group     BP 11/14/20 0849 119/75     Pulse Rate 11/14/20 0849 68     Resp 11/14/20 0849 20     Temp 11/14/20 0849 99.1 F (37.3 C)     Temp Source 11/14/20 0849 Oral     SpO2 11/14/20 0849 96 %     Weight --      Height --      Head Circumference --      Peak Flow --      Pain Score 11/14/20 0847 6     Pain Loc --      Pain Edu? --      Excl. in GC? --    No  data found.  Updated Vital Signs BP 119/75 (BP Location: Left Arm)   Pulse 68   Temp 99.1 F (37.3 C) (Oral)   Resp 20   LMP 11/06/2020   SpO2 96%   Visual Acuity Right Eye Distance:   Left Eye Distance:   Bilateral Distance:    Right Eye Near:   Left Eye Near:    Bilateral Near:     Physical Exam Vitals and nursing note reviewed.  Constitutional:      Appearance: Normal appearance. She is not ill-appearing.  HENT:     Head: Atraumatic.     Right Ear: Tympanic membrane normal.     Left Ear: Tympanic membrane normal.     Nose: Nose normal.     Mouth/Throat:     Mouth: Mucous membranes are moist.     Pharynx: Posterior oropharyngeal erythema present. No oropharyngeal exudate.  Eyes:     Extraocular Movements: Extraocular movements intact.     Conjunctiva/sclera: Conjunctivae normal.  Cardiovascular:     Rate and Rhythm: Normal rate and regular rhythm.     Heart sounds: Normal heart sounds.  Pulmonary:     Effort: Pulmonary effort is normal. No respiratory distress.     Breath sounds: Normal breath sounds. No wheezing or rales.  Abdominal:     General: Bowel sounds are normal. There is no distension.     Palpations: Abdomen is soft.     Tenderness: There is no abdominal tenderness. There is no guarding.  Musculoskeletal:        General: Normal range of motion.     Cervical back: Normal range of motion and neck supple.  Skin:    General: Skin is warm and dry.  Neurological:     Mental Status: She is alert and oriented to person, place, and time.  Psychiatric:        Mood and Affect: Mood normal.        Thought Content: Thought content normal.        Judgment: Judgment normal.      UC Treatments / Results  Labs (all labs ordered are listed, but only abnormal results are displayed) Labs Reviewed  SARS CORONAVIRUS 2 (TAT 6-24 HRS)  EKG   Radiology No results found.  Procedures Procedures (including critical care time)  Medications Ordered in  UC Medications - No data to display  Initial Impression / Assessment and Plan / UC Course  I have reviewed the triage vital signs and the nursing notes.  Pertinent labs & imaging results that were available during my care of the patient were reviewed by me and considered in my medical decision making (see chart for details).     Suspicious for COVID 19, PCR test pending, vitals and exam reassuring. Discussed zofran, phenergan DM, OTC remedies and supportive care. Albuterol prn. Return for acutely worsening sxs. WOrk note given.   Final Clinical Impressions(s) / UC Diagnoses   Final diagnoses:  Viral URI with cough   Discharge Instructions   None    ED Prescriptions    Medication Sig Dispense Auth. Provider   ondansetron (ZOFRAN ODT) 4 MG disintegrating tablet Take 1 tablet (4 mg total) by mouth every 8 (eight) hours as needed for nausea or vomiting. 20 tablet Particia Nearing, [place]   promethazine-dextromethorphan (PROMETHAZINE-DM) 6.25-15 MG/5ML syrup Take 5 mLs by mouth 4 (four) times daily as needed for cough. 100 mL Particia Nearing, [place]     PDMP not reviewed this encounter.   Particia Nearing, [place] 11/14/20 1128

## 2021-01-10 ENCOUNTER — Ambulatory Visit (INDEPENDENT_AMBULATORY_CARE_PROVIDER_SITE_OTHER): Payer: BC Managed Care – PPO | Admitting: Family Medicine

## 2021-01-10 ENCOUNTER — Other Ambulatory Visit: Payer: Self-pay

## 2021-01-10 ENCOUNTER — Encounter: Payer: Self-pay | Admitting: Family Medicine

## 2021-01-10 VITALS — BP 110/70 | HR 71 | Ht 67.0 in | Wt 178.6 lb

## 2021-01-10 DIAGNOSIS — L02419 Cutaneous abscess of limb, unspecified: Secondary | ICD-10-CM | POA: Diagnosis not present

## 2021-01-10 DIAGNOSIS — Z1159 Encounter for screening for other viral diseases: Secondary | ICD-10-CM | POA: Diagnosis not present

## 2021-01-10 LAB — POCT URINE PREGNANCY: Preg Test, Ur: NEGATIVE

## 2021-01-10 MED ORDER — DOXYCYCLINE HYCLATE 100 MG PO TABS: 100.0000 mg | ORAL_TABLET | Freq: Two times a day (BID) | ORAL | 0 refills | Status: DC

## 2021-01-10 NOTE — Patient Instructions (Addendum)
It was wonderful to see you today.  Please bring ALL of your medications with you to every visit.   Today we talked about:   - Taking an antibiotic twice daily for 5 days   - Take this with food---avoid a lot of sunlight while on this medication  Please return in 4 weeks for a Pap   I will send you a MyChart or call with your labs    Thank you for choosing [facility].   Please call [phone] with any questions about today's appointment.  Please be sure to schedule follow up at the front  desk before you leave today.   Terisa Starr, MD  Family Medicine

## 2021-01-10 NOTE — Progress Notes (Signed)
    SUBJECTIVE:   CHIEF COMPLAINT / HPI:   Samantha Hardin is a pleasant 30 year old woman presenting with right axillary abscess.  The patient reports a 1 week history of a 'nodule' that is growing. This began without trigger, had some purulent discharge and decreased in size. Now area around this is enlarging. No breast pain, masses, fevers, chills. No family history of breast pain. She is RHD and works at [employer].   She works at [employer]. Currently thinking about building a house, maybe having a child. Using nothing for contraception. She is allergic to latex and hormonal therapy causes migraines. LMP end of February. Has 1 partner (of 10 years).    PERTINENT  PMH / PSH/Family/Social History : reviewed and updated--asthma and migraines   OBJECTIVE:   BP 110/70   Pulse 71   Ht 5\' 7"  (1.702 m)   Wt 178 lb 9.6 oz (81 kg)   LMP 12/16/2020 (Approximate)   SpO2 100%   BMI 27.97 kg/m   Today's weight:  Last Weight  Most recent update: 01/10/2021  8:42 AM   Weight  81 kg (178 lb 9.6 oz)           Review of prior weights: Filed Weights   01/10/21 0841  Weight: 178 lb 9.6 oz (81 kg)     Cardiac: Regular rate and rhythm. Normal S1/S2.  Lungs: Clear bilaterally to ascultation.  Psych: Pleasant and appropriate   Axilla: Chaperoned exam. Left axilla without LAD. Right axilla with small 1.5 cm draining abscess with + induration of 3 cm inferiorly, mildly tender. No masses or adenopathy.   ASSESSMENT/PLAN:   Abscess of R Axilla, with cellulitis, acute, not associated with systemic symptoms, draining and not requiring incision today. Given surrounding erythema, Rx doxycycline, discussed risks. Pregnancy test negative. Reviewed return precautions if not improving obtain imaging with mammogram as well. Also considered but less likely causes of Bartonella,  exam not consistent with lymphadenopathy but rather abscess of hair follicle. Discussed continued use of sensitive soaps, can  limiting shaving if recurrent.     HCM Agreeable to Hep C today Discussed PNV Follow up with PCP in 4-6 weeks for Pap  Received covid vaccine X2 at work--will be due for booster in April     May, MD  Family Medicine Teaching Service  [facility]

## 2021-01-11 LAB — HEPATITIS C ANTIBODY: Hep C Virus Ab: <0.1 s/co ratio (ref 0.0–0.9)

## 2021-02-24 NOTE — Progress Notes (Incomplete)
    SUBJECTIVE:   CHIEF COMPLAINT / HPI:   Pap Smear Last Pap smear *** with ***.  Prior paps: *** No LMP recorded. Postmenopausal bleeding: *** Contraception: ***  Sexually Active: ***  Desire for STD Screening: ***  Last mammogram: *** Self breast exams: ***  Concerns: ***    PERTINENT  PMH / PSH: ***  OBJECTIVE:   There were no vitals taken for this visit.  ***  ASSESSMENT/PLAN:   No problem-specific Assessment & Plan notes found for this encounter.     Lavonda Jumbo, DO  [facility]   {    This will disappear when note is signed, click to select method of visit    :1}

## 2021-02-25 ENCOUNTER — Ambulatory Visit: Payer: BC Managed Care – PPO | Admitting: Family Medicine

## 2021-09-15 ENCOUNTER — Ambulatory Visit (HOSPITAL_COMMUNITY)
Admission: EM | Admit: 2021-09-15 | Discharge: 2021-09-15 | Disposition: A | Discharge status: Home / Self Care | Payer: BC Managed Care – PPO | Attending: Family Medicine | Admitting: Family Medicine

## 2021-09-15 ENCOUNTER — Encounter (HOSPITAL_COMMUNITY): Payer: Self-pay

## 2021-09-15 ENCOUNTER — Inpatient Hospital Stay (HOSPITAL_COMMUNITY)
Admission: AD | Admit: 2021-09-15 | Discharge: 2021-09-15 | Disposition: A | Discharge status: Home / Self Care | Payer: BC Managed Care – PPO | Attending: Obstetrics and Gynecology | Admitting: Obstetrics and Gynecology

## 2021-09-15 ENCOUNTER — Other Ambulatory Visit: Payer: Self-pay

## 2021-09-15 ENCOUNTER — Encounter (HOSPITAL_COMMUNITY): Payer: Self-pay | Admitting: Obstetrics and Gynecology

## 2021-09-15 ENCOUNTER — Inpatient Hospital Stay (HOSPITAL_COMMUNITY): Payer: BC Managed Care – PPO

## 2021-09-15 DIAGNOSIS — Z87891 Personal history of nicotine dependence: Secondary | ICD-10-CM | POA: Insufficient documentation

## 2021-09-15 DIAGNOSIS — O209 Hemorrhage in early pregnancy, unspecified: Secondary | ICD-10-CM

## 2021-09-15 DIAGNOSIS — N83292 Other ovarian cyst, left side: Secondary | ICD-10-CM | POA: Diagnosis not present

## 2021-09-15 DIAGNOSIS — Z3A01 Less than 8 weeks gestation of pregnancy: Secondary | ICD-10-CM | POA: Diagnosis not present

## 2021-09-15 DIAGNOSIS — O26899 Other specified pregnancy related conditions, unspecified trimester: Secondary | ICD-10-CM

## 2021-09-15 DIAGNOSIS — A599 Trichomoniasis, unspecified: Secondary | ICD-10-CM

## 2021-09-15 DIAGNOSIS — R109 Unspecified abdominal pain: Secondary | ICD-10-CM | POA: Diagnosis not present

## 2021-09-15 DIAGNOSIS — O3481 Maternal care for other abnormalities of pelvic organs, first trimester: Secondary | ICD-10-CM | POA: Diagnosis not present

## 2021-09-15 DIAGNOSIS — O34219 Maternal care for unspecified type scar from previous cesarean delivery: Secondary | ICD-10-CM | POA: Insufficient documentation

## 2021-09-15 DIAGNOSIS — O26851 Spotting complicating pregnancy, first trimester: Secondary | ICD-10-CM | POA: Diagnosis not present

## 2021-09-15 DIAGNOSIS — N644 Mastodynia: Secondary | ICD-10-CM | POA: Diagnosis not present

## 2021-09-15 DIAGNOSIS — N939 Abnormal uterine and vaginal bleeding, unspecified: Secondary | ICD-10-CM | POA: Diagnosis not present

## 2021-09-15 DIAGNOSIS — Z3201 Encounter for pregnancy test, result positive: Secondary | ICD-10-CM | POA: Diagnosis not present

## 2021-09-15 DIAGNOSIS — N83202 Unspecified ovarian cyst, left side: Secondary | ICD-10-CM | POA: Diagnosis not present

## 2021-09-15 DIAGNOSIS — O23591 Infection of other part of genital tract in pregnancy, first trimester: Secondary | ICD-10-CM | POA: Insufficient documentation

## 2021-09-15 DIAGNOSIS — A5901 Trichomonal vulvovaginitis: Secondary | ICD-10-CM | POA: Insufficient documentation

## 2021-09-15 DIAGNOSIS — O26891 Other specified pregnancy related conditions, first trimester: Secondary | ICD-10-CM | POA: Insufficient documentation

## 2021-09-15 LAB — CBC WITH DIFFERENTIAL/PLATELET
Abs Immature Granulocytes: 0.02 10*3/uL (ref 0.00–0.07)
Basophils Absolute: 0 10*3/uL (ref 0.0–0.1)
Basophils Relative: 0 %
Eosinophils Absolute: 0.1 10*3/uL (ref 0.0–0.5)
Eosinophils Relative: 1 %
HCT: 38.6 % (ref 36.0–46.0)
Hemoglobin: 13.2 g/dL (ref 12.0–15.0)
Immature Granulocytes: 0 %
Lymphocytes Relative: 38 %
Lymphs Abs: 1.8 10*3/uL (ref 0.7–4.0)
MCH: 29.9 pg (ref 26.0–34.0)
MCHC: 34.2 g/dL (ref 30.0–36.0)
MCV: 87.5 fL (ref 80.0–100.0)
Monocytes Absolute: 0.6 10*3/uL (ref 0.1–1.0)
Monocytes Relative: 12 %
Neutro Abs: 2.3 10*3/uL (ref 1.7–7.7)
Neutrophils Relative %: 49 %
Platelets: 224 10*3/uL (ref 150–400)
RBC: 4.41 MIL/uL (ref 3.87–5.11)
RDW: 12.5 % (ref 11.5–15.5)
WBC: 4.8 10*3/uL (ref 4.0–10.5)
nRBC: 0 % (ref 0.0–0.2)

## 2021-09-15 LAB — URINALYSIS, ROUTINE W REFLEX MICROSCOPIC
Bilirubin Urine: NEGATIVE
Glucose, UA: NEGATIVE mg/dL
Ketones, ur: NEGATIVE mg/dL
Nitrite: NEGATIVE
Protein, ur: NEGATIVE mg/dL
Specific Gravity, Urine: 1.028 (ref 1.005–1.030)
pH: 6 (ref 5.0–8.0)

## 2021-09-15 LAB — WET PREP, GENITAL
Clue Cells Wet Prep HPF POC: NONE SEEN
Sperm: NONE SEEN
WBC, Wet Prep HPF POC: >=10 — AB (ref <10)
Yeast Wet Prep HPF POC: NONE SEEN

## 2021-09-15 LAB — HCG, QUANTITATIVE, PREGNANCY: hCG, Beta Chain, Quant, S: 14647 m[IU]/mL — ABNORMAL HIGH (ref <5)

## 2021-09-15 LAB — POC URINE PREG, ED: Preg Test, Ur: POSITIVE — AB

## 2021-09-15 MED ORDER — ACETAMINOPHEN 500 MG PO TABS
1000.0000 mg | ORAL_TABLET | Freq: Once | ORAL | Status: AC
  Administered 2021-09-15: 1000 mg via ORAL
  Filled 2021-09-15: qty 2

## 2021-09-15 MED ORDER — RHO D IMMUNE GLOBULIN 1500 UNIT/2ML IJ SOSY
300.0000 ug | PREFILLED_SYRINGE | Freq: Once | INTRAMUSCULAR | Status: DC
  Filled 2021-09-15: qty 2

## 2021-09-15 MED ORDER — RHO D IMMUNE GLOBULIN 1500 UNIT/2ML IJ SOSY
300.0000 ug | PREFILLED_SYRINGE | Freq: Once | INTRAMUSCULAR | Status: AC
  Administered 2021-09-15: 300 ug via INTRAMUSCULAR
  Filled 2021-09-15: qty 2

## 2021-09-15 MED ORDER — METRONIDAZOLE 500 MG PO TABS
2000.0000 mg | ORAL_TABLET | Freq: Once | ORAL | Status: AC
  Administered 2021-09-15: 2000 mg via ORAL
  Filled 2021-09-15: qty 4

## 2021-09-15 NOTE — MAU Note (Addendum)
Presents stating she's having VB, reports she had spotting last week and today had VB that was heavier today.  Denies saturated through sanitary napkin, and states now she's just spotting with wiping.  Unsure LMP.  +HPT today & one @ Urgent Care today.

## 2021-09-15 NOTE — Discharge Instructions (Signed)
You can continue to take tylenol 1000mg  up to 4 times daily as needed for abdominal cramping. Please make sure you are hydrated.

## 2021-09-15 NOTE — ED Triage Notes (Signed)
Pt reports lower abdominal pain and spotting x 1 week. Pt reports she had a positive pregnancy test at home today.

## 2021-09-15 NOTE — MAU Provider Note (Addendum)
History     [id number]  Arrival date and time: 09/15/21 1340    Chief Complaint  Patient presents with   Vaginal Bleeding    HPI Samantha Hardin is a 30 y.o., G4P1021 at [redacted]w[redacted]d by LMP, who presents for evaluation of vaginal bleeding.   [name] notes that she typically has regular periods, but her last periods were on September 11 and 23, and October 11 and 23. She reports noticing some spotting over the last week, seen with wiping. More spotting recurred this morning and after feeling intermittent cramps and breast tenderness, she decided to take a home pregnancy test which was positive. Denies fevers, chills, nausea, vomiting, or dysuria. Denies any previous STIs, has had one prior UTI.  Vaginal bleeding: Yes LOF: No Fetal Movement: No Contractions: No  Prior Cesarean section in 2017 for one living child.  Thinks she took Rhogam previously. No other OBGYN Hx  Past Medical History:  Diagnosis Date   Anemia    Asthma    Depression    never on meds, doing ok   Headache(784.0)    when on birth control   Heart murmur    Migraines     Past Surgical History:  Procedure Laterality Date   CESAREAN SECTION N/A 09/14/2016   Procedure: CESAREAN SECTION;  Surgeon: Levie Heritage, DO;  Location: [facility] BIRTHING SUITES;  Service: Obstetrics;  Laterality: N/A;   INDUCED ABORTION     PERINEAL LACERATION REPAIR N/A 09/14/2016   Procedure: Larene Pickett BALLOON PLACEMENT;  Surgeon: Levie Heritage, DO;  Location: [facility] BIRTHING SUITES;  Service: Obstetrics;  Laterality: N/A;    Family History  Problem Relation Age of Onset   Heart disease Maternal Grandfather    Diabetes Maternal Grandfather    Hypertension Maternal Grandfather    Healthy Mother    Healthy Father    Anesthesia problems Neg Hx     Social History   Socioeconomic History   Marital status: Single    Spouse name: Not on file   Number of children: 1   Years of education: Not on file   Highest education level: Not on file   Occupational History    Employer: [id number]    Comment: Works in produce.  Tobacco Use   Smoking status: Former    Packs/day: 0.25    Types: Cigarettes, Cigars    Quit date: 09/15/2021   Smokeless tobacco: Never  Substance and Sexual Activity   Alcohol use: Not Currently    Comment: Infrequently.   Drug use: Yes    Types: Marijuana   Sexual activity: Yes    Birth control/protection: None  Other Topics Concern   Not on file  Social History Narrative   Not on file   Social Determinants of Health   Financial Resource Strain: Not on file  Food Insecurity: Not on file  Transportation Needs: Not on file  Physical Activity: Not on file  Stress: Not on file  Social Connections: Not on file  Intimate Partner Violence: Not on file    Allergies  Allergen Reactions   Latex Swelling and Rash    No current facility-administered medications on file prior to encounter.   Current Outpatient Medications on File Prior to Encounter  Medication Sig Dispense Refill   albuterol (VENTOLIN HFA) 108 (90 Base) MCG/ACT inhaler Inhale 2 puffs into the lungs every 6 (six) hours as needed for wheezing or shortness of breath. 1 each 1   benzonatate (TESSALON) 100 MG capsule Take 1 capsule (  100 mg total) by mouth 3 (three) times daily as needed for cough. 30 capsule 0   cyclobenzaprine (FLEXERIL) 5 MG tablet Take 1 tablet (5 mg total) by mouth 3 (three) times daily as needed for muscle spasms. 30 tablet 0   doxycycline (VIBRA-TABS) 100 MG tablet Take 1 tablet (100 mg total) by mouth 2 (two) times daily. 10 tablet 0   ondansetron (ZOFRAN ODT) 4 MG disintegrating tablet Take 1 tablet (4 mg total) by mouth every 8 (eight) hours as needed for nausea or vomiting. 20 tablet 0   promethazine-dextromethorphan (PROMETHAZINE-DM) 6.25-15 MG/5ML syrup Take 5 mLs by mouth 4 (four) times daily as needed for cough. 100 mL 0   [DISCONTINUED] cetirizine (ZYRTEC) 10 MG tablet Take 1 tablet (10 mg total) by mouth daily.  30 tablet 0   [DISCONTINUED] famotidine (PEPCID) 20 MG tablet Take 1 tablet (20 mg total) by mouth 2 (two) times daily. 30 tablet 0     Review of Systems  Constitutional:  Negative for chills, fever and malaise/fatigue.  HENT:  Negative for congestion.   Eyes:  Negative for blurred vision.  Respiratory:  Negative for shortness of breath.   Cardiovascular:  Negative for chest pain.  Gastrointestinal:  Positive for abdominal pain. Negative for constipation, diarrhea, nausea and vomiting.  Genitourinary:  Negative for dysuria, flank pain, frequency and hematuria.  Musculoskeletal:  Negative for back pain.  Neurological:  Negative for dizziness and weakness.  Pertinent positives and negative per HPI, all others reviewed and negative  Physical Exam   BP 130/72 (BP Location: Right Arm)   Pulse 81   Temp 98.8 F (37.1 C) (Oral)   Resp 18   Wt 73.3 kg   LMP 08/14/2021   SpO2 97%   BMI 25.29 kg/m    General: alert and pleasant, sitting up comfortably in bed Cardio: RRR. Normal S1 and S2. Lungs: CTAB. Normal WOB. Abdomen: Soft with mild tenderness to moderate palpation over lateral abdomen b/l. No rebound or guarding. GU: Cervix without signs of bleeding. Skin: warm, dry, good turgor and cap refill <2sec Neuro: A&Ox3. No focal deficit. Pscyh: Mood and behavior appropriate.   Bedside Ultrasound Early IUP w estimated GA of [redacted]w[redacted]d based on mean sac diameter.  My interpretation: Gestational sac and embryo sac both visualized IUP  Labs Results for orders placed or performed during the hospital encounter of 09/15/21 (from the past 24 hour(s))  Urinalysis, Routine w reflex microscopic Urine, Clean Catch     Status: Abnormal   Collection Time: 09/15/21  2:51 PM  Result Value Ref Range   Color, Urine YELLOW YELLOW   APPearance CLOUDY (A) CLEAR   Specific Gravity, Urine 1.028 1.005 - 1.030   pH 6.0 5.0 - 8.0   Glucose, UA NEGATIVE NEGATIVE mg/dL   Hgb urine dipstick MODERATE (A)  NEGATIVE   Bilirubin Urine NEGATIVE NEGATIVE   Ketones, ur NEGATIVE NEGATIVE mg/dL   Protein, ur NEGATIVE NEGATIVE mg/dL   Nitrite NEGATIVE NEGATIVE   Leukocytes,Ua TRACE (A) NEGATIVE   RBC / HPF 0-5 0 - 5 RBC/hpf   WBC, UA 6-10 0 - 5 WBC/hpf   Bacteria, UA RARE (A) NONE SEEN   Squamous Epithelial / LPF 21-50 0 - 5   Mucus PRESENT   CBC with Differential/Platelet     Status: None   Collection Time: 09/15/21  3:05 PM  Result Value Ref Range   WBC 4.8 4.0 - 10.5 K/uL   RBC 4.41 3.87 - 5.11 MIL/uL  Hemoglobin 13.2 12.0 - 15.0 g/dL   HCT 38.6 36.0 - 46.0 %   MCV 87.5 80.0 - 100.0 fL   MCH 29.9 26.0 - 34.0 pg   MCHC 34.2 30.0 - 36.0 g/dL   RDW 12.5 11.5 - 15.5 %   Platelets 224 150 - 400 K/uL   nRBC 0.0 0.0 - 0.2 %   Neutrophils Relative % 49 %   Neutro Abs 2.3 1.7 - 7.7 K/uL   Lymphocytes Relative 38 %   Lymphs Abs 1.8 0.7 - 4.0 K/uL   Monocytes Relative 12 %   Monocytes Absolute 0.6 0.1 - 1.0 K/uL   Eosinophils Relative 1 %   Eosinophils Absolute 0.1 0.0 - 0.5 K/uL   Basophils Relative 0 %   Basophils Absolute 0.0 0.0 - 0.1 K/uL   Immature Granulocytes 0 %   Abs Immature Granulocytes 0.02 0.00 - 0.07 K/uL    Imaging No results found.  MAU Course  Procedures Lab Orders         Wet prep, genital         Urinalysis, Routine w reflex microscopic Urine, Clean Catch         hCG, quantitative, pregnancy         CBC with Differential/Platelet    Meds ordered this encounter  Medications   acetaminophen (TYLENOL) tablet 1,000 mg   Imaging Orders         US OB LESS THAN 14 WEEKS WITH OB TRANSVAGINAL      MDM Assessment and Plan  3:00p AMYE KUPFERMAN is a 30 y/o G4P1021 at [redacted]w[redacted]d GA, with previous Cesarean section who presents today with spotting for 1 week. Also endorses mild, intermittent sharp pain in her mid-abdomen on both lateral aspects, similar to cramping. Denies nausea, vomiting, fevers, chills, or dysuria. Vital signs and exam reassuring. No history of STIs but  does have history of a spontaneous abortion. Will look to rule out missed/threatened abortion, and pregnancy complicated by STI or UTI with our workup. Feel an ectopic pregnancy is less likely in this setting without abrupt onset of abdominal pain, but will order [place] to not miss this diagnosis.  4:00p UA revealed Moderate Hgb on urine dipstick. CBC w/diff entirely WNL including Hgb at 13.2. Wet prep with Trichomonas 09/15/21 US revealing a [redacted]w[redacted]d IUP [place]CG at 14[address], appropriately Rh IG workup: AB- with a negative antibody screen  Trichomonas infection likely causing increased spotting, in the absence of other pertinent findings, as above. Cannot rule out threatened abortion so will recommend this pt have her b-hCG checked again in 48 hours.  Assessment and Plan  CADIA COBO is a 30 y/o G4P1021 at [redacted]w[redacted]d GA, with previous Cesarean section who presents today with spotting for 1 week.   Vaginal spotting -Recheck Quant B-HCG in 48 hours -Rhogam today -GC/Chlamydia pending, will send abx if positive  Trichomonas infection -PO Flagyl 2g  Will return to care with [facility] where she has received her OB care in the past.   London Pepper  GME ATTESTATION:  I saw and evaluated the patient. I agree with the findings and the plan of care as documented in the student's note. In addition:   Lafern is a 30 yo F [id number] with unknown LMP (recently irregular) presenting for evaluation of vaginal spotting. Notices mainly with wiping, however has had a few drops on a pad, occurred for about 5 days last week and re-started this morning. First UPT was  today and positive, taken after she also started having some bilateral abdominal cramping and breast soreness..   Denies changes in vaginal discharge, dysuria, fever, nausea, vomiting, change in eating habits. Denies lightheadedness/dizziness.   Workup as above in student's note.   A/P:  Vaginal spotting and cramping in first  trimester of pregnancy:  Early IUP at [redacted]w[redacted]d based off U/S, no embryo visualized yet. BHCG 14,647. While vaginal spotting may be in the setting of below, will cont with 48 hour B-Hcg lab at [facility] to ensure appropriate trend. Appt scheduled. Encouraged adequate hydration and tylenol PRN.   Trichomonas vaginitis  Wet prep positive. Given 2g flagyl here for treatment. Instructed to abstain from sexual activity for the next week and until after her partner has been treated.  RH Negative Although early in pregnancy, proceed with RhoGAM.   MAU precautions discussed.    Physical Exam Constitutional:      General: She is not in acute distress.    Appearance: Normal appearance. She is not ill-appearing.  HENT:     Head: Normocephalic and atraumatic.     Mouth/Throat:     Mouth: Mucous membranes are moist.  Eyes:     Extraocular Movements: Extraocular movements intact.  Cardiovascular:     Pulses: Normal pulses.  Pulmonary:     Effort: Pulmonary effort is normal.  Abdominal:     General: There is no distension.     Palpations: Abdomen is soft.     Tenderness: There is no right CVA tenderness or left CVA tenderness.     Comments: Mild tenderness to bilateral lateral portions of her abdomen upper/lower quadrants without rebounding or tenderness. No localized tenderness.   Genitourinary:    Comments: Pelvic exam: VULVA: normal appearing vulva with no masses, tenderness or lesions, VAGINA: normal appearing vagina with normal color, no lesions, CERVIX: normal appearing cervix without lesions, Small amount of pink discharge present at the cervical os. Some clear mucoid discharge present.  Skin:    General: Skin is warm and dry.     Capillary Refill: Capillary refill takes less than 2 seconds.  Neurological:     Mental Status: She is alert.  Psychiatric:        Mood and Affect: Mood normal.        Behavior: Behavior normal.     Darrelyn Hillock, DO OB Fellow, [place]  for [place] 09/15/2021 8:20 PM

## 2021-09-16 LAB — GC/CHLAMYDIA PROBE AMP (~~LOC~~) NOT AT ARMC
Chlamydia: NEGATIVE
Comment: NEGATIVE
Comment: NORMAL
Neisseria Gonorrhea: NEGATIVE

## 2021-09-16 LAB — RH IG WORKUP (INCLUDES ABO/RH)
ABO/RH(D): AB NEG
Antibody Screen: NEGATIVE
Gestational Age(Wks): 5
Unit division: 0

## 2021-09-17 ENCOUNTER — Telehealth: Payer: Self-pay | Admitting: General Practice

## 2021-09-17 ENCOUNTER — Ambulatory Visit: Payer: BC Managed Care – PPO

## 2021-09-17 NOTE — ED Provider Notes (Signed)
[facility]   [id number] 09/15/21 Arrival Time: 1209  ASSESSMENT & PLAN:  1. Vaginal bleeding   2. Positive pregnancy test    Discussed with RN in triage. Pos pregnancy test with abd pain. Sent to MAU. Stable.    Follow-up Information     Go to  [facility] (MAU).   Contact information: [address] [place],  [place]  [address]                 SUBJECTIVE: History from: patient. Samantha Hardin is a 30 y.o. female who reports + preg test at home; with spotting an abd pain.  Patient's last menstrual period was 08/14/2021.   Patient's last menstrual period was 08/14/2021. Past Surgical History:  Procedure Laterality Date   CESAREAN SECTION N/A 09/14/2016   Procedure: CESAREAN SECTION;  Surgeon: Levie Heritage, DO;  Location: [facility] BIRTHING SUITES;  Service: Obstetrics;  Laterality: N/A;   INDUCED ABORTION     PERINEAL LACERATION REPAIR N/A 09/14/2016   Procedure: Larene Pickett BALLOON PLACEMENT;  Surgeon: Levie Heritage, DO;  Location: [facility] BIRTHING SUITES;  Service: Obstetrics;  Laterality: N/A;     OBJECTIVE:  Vitals:   09/15/21 1322  BP: 125/87  Pulse: 85  Resp: 18  Temp: 98.2 F (36.8 C)  TempSrc: Oral  SpO2: 97%    General appearance: alert, oriented, no acute distress Psychological: alert and cooperative; normal mood and affect  Labs: Results for orders placed or performed during the hospital encounter of 09/15/21  POC urine pregnancy  Result Value Ref Range   Preg Test, Ur POSITIVE (A) NEGATIVE   Labs Reviewed  POC URINE PREG, ED - Abnormal; Notable for the following components:      Result Value   Preg Test, Ur POSITIVE (*)    All other components within normal limits  POCT URINALYSIS DIPSTICK, ED / UC    Imaging: No results found.   Allergies  Allergen Reactions   Latex Swelling and Rash                                               Past Medical History:  Diagnosis Date    Anemia    Asthma    Depression    never on meds, doing ok   Headache(784.0)    when on birth control   Heart murmur    Migraines     Social History   Socioeconomic History   Marital status: Single    Spouse name: Not on file   Number of children: 1   Years of education: Not on file   Highest education level: Not on file  Occupational History    Employer: [id number]    Comment: Works in produce.  Tobacco Use   Smoking status: Former    Packs/day: 0.25    Types: Cigarettes, Cigars    Quit date: 09/15/2021   Smokeless tobacco: Never  Substance and Sexual Activity   Alcohol use: Not Currently    Comment: Infrequently.   Drug use: Yes    Types: Marijuana   Sexual activity: Yes    Birth control/protection: None  Other Topics Concern   Not on file  Social History Narrative   Not on file   Social Determinants of Health   Financial Resource Strain: Not on file  Food Insecurity:  Not on file  Transportation Needs: Not on file  Physical Activity: Not on file  Stress: Not on file  Social Connections: Not on file  Intimate Partner Violence: Not on file    Family History  Problem Relation Age of Onset   Heart disease Maternal Grandfather    Diabetes Maternal Grandfather    Hypertension Maternal Grandfather    Healthy Mother    Healthy Father    Anesthesia problems Neg Hx      Mardella Layman, MD 09/17/21 [phone]

## 2021-09-17 NOTE — Telephone Encounter (Signed)
Called patient regarding missed stat bhcg appt. Patient states she is headed out of town and is not able to come in. She denies pain/bleeding. Advised patient to return to MAU on Friday for repeat blood work. Patient verbalized understanding.

## 2021-09-22 ENCOUNTER — Inpatient Hospital Stay (HOSPITAL_COMMUNITY)
Admission: AD | Admit: 2021-09-22 | Discharge: 2021-09-22 | Disposition: A | Discharge status: Home / Self Care | Payer: BC Managed Care – PPO | Attending: Family Medicine | Admitting: Family Medicine

## 2021-09-22 ENCOUNTER — Inpatient Hospital Stay (HOSPITAL_COMMUNITY): Payer: BC Managed Care – PPO

## 2021-09-22 ENCOUNTER — Other Ambulatory Visit: Payer: Self-pay

## 2021-09-22 ENCOUNTER — Encounter (HOSPITAL_COMMUNITY): Payer: Self-pay | Admitting: Family Medicine

## 2021-09-22 DIAGNOSIS — O26891 Other specified pregnancy related conditions, first trimester: Secondary | ICD-10-CM | POA: Diagnosis not present

## 2021-09-22 DIAGNOSIS — R1030 Lower abdominal pain, unspecified: Secondary | ICD-10-CM | POA: Diagnosis not present

## 2021-09-22 DIAGNOSIS — Z3A01 Less than 8 weeks gestation of pregnancy: Secondary | ICD-10-CM | POA: Diagnosis not present

## 2021-09-22 DIAGNOSIS — Z87891 Personal history of nicotine dependence: Secondary | ICD-10-CM | POA: Insufficient documentation

## 2021-09-22 DIAGNOSIS — O26899 Other specified pregnancy related conditions, unspecified trimester: Secondary | ICD-10-CM

## 2021-09-22 DIAGNOSIS — R531 Weakness: Secondary | ICD-10-CM | POA: Insufficient documentation

## 2021-09-22 HISTORY — DX: Trichomoniasis, unspecified: A59.9

## 2021-09-22 LAB — URINALYSIS, ROUTINE W REFLEX MICROSCOPIC
Bilirubin Urine: NEGATIVE
Glucose, UA: NEGATIVE mg/dL
Hgb urine dipstick: NEGATIVE
Ketones, ur: NEGATIVE mg/dL
Leukocytes,Ua: NEGATIVE
Nitrite: NEGATIVE
Protein, ur: NEGATIVE mg/dL
Specific Gravity, Urine: 1.021 (ref 1.005–1.030)
pH: 6 (ref 5.0–8.0)

## 2021-09-22 MED ORDER — PREPLUS 27-1 MG PO TABS
1.0000 | ORAL_TABLET | Freq: Every day | ORAL | 13 refills | Status: DC
Start: 2021-09-22 — End: 2023-04-19

## 2021-09-22 NOTE — MAU Note (Signed)
Pressure in lower abd.  Feels weak. Somebody at [facility] office said it may be that she needs her prenatal pills, doesn't have appt  12/9.

## 2021-09-22 NOTE — MAU Note (Signed)
Presents with c/o intermittent lower abdominal pain that began Saturday.  Denies VB.   Also c/o weakness and needs Rx for PNV.  Missed repeat HCG level scheduled to be drawn last Wednesday.

## 2021-09-22 NOTE — Discharge Instructions (Signed)

## 2021-09-22 NOTE — MAU Provider Note (Signed)
History     CSN: [id number]  Arrival date and time: 09/22/21 1111   None     Chief Complaint  Patient presents with   Abdominal Pain   Weakness   HPI Samantha Hardin is a 30 y.o. G4P1021 at [redacted]w[redacted]d by LMP who presents to MAU for lower abdominal pain. Patient reports intermittent pressure-like pain in lower abdomen since Saturday. She denies vaginal bleeding, discharge, itching, odor, or urinary s/s. Reports that she has had some loose stools since Thursday, however reports only 1 episode per day. No nausea or vomiting. Of note, patient was evaluated in MAU on 11/21 and was found to be positive for trichomonas. She and partner were both treated on the same day. Has appointment scheduled at [facility] on 12/9.  OB History     Gravida  4   Para  1   Term  1   Preterm  0   AB  2   Living  1      SAB  0   IAB  2   Ectopic  0   Multiple  0   Live Births  1           Past Medical History:  Diagnosis Date   Anemia    Asthma    Depression    never on meds, doing ok   Headache(784.0)    when on birth control   Heart murmur    Migraines    Trichomonas infection     Past Surgical History:  Procedure Laterality Date   CESAREAN SECTION N/A 09/14/2016   Procedure: CESAREAN SECTION;  Surgeon: Levie Heritage, DO;  Location: [facility] BIRTHING SUITES;  Service: Obstetrics;  Laterality: N/A;   INDUCED ABORTION     PERINEAL LACERATION REPAIR N/A 09/14/2016   Procedure: Larene Pickett BALLOON PLACEMENT;  Surgeon: Levie Heritage, DO;  Location: [facility] BIRTHING SUITES;  Service: Obstetrics;  Laterality: N/A;    Family History  Problem Relation Age of Onset   Heart disease Maternal Grandfather    Diabetes Maternal Grandfather    Hypertension Maternal Grandfather    Healthy Mother    Healthy Father    Anesthesia problems Neg Hx     Social History   Tobacco Use   Smoking status: Former    Packs/day: 0.25    Types: Cigarettes, Cigars    Quit date: 09/15/2021     Years since quitting: 0.0   Smokeless tobacco: Never  Vaping Use   Vaping Use: Never used  Substance Use Topics   Alcohol use: Not Currently    Comment: Infrequently.   Drug use: Not Currently    Types: Marijuana    Comment: 11/21, not since found out    Allergies:  Allergies  Allergen Reactions   Latex Swelling and Rash    Medications Prior to Admission  Medication Sig Dispense Refill Last Dose   albuterol (VENTOLIN HFA) 108 (90 Base) MCG/ACT inhaler Inhale 2 puffs into the lungs every 6 (six) hours as needed for wheezing or shortness of breath. 1 each 1 More than a month    Review of Systems  Constitutional:  Positive for fatigue.  Respiratory: Negative.    Cardiovascular: Negative.   Gastrointestinal:  Positive for abdominal pain and diarrhea. Negative for constipation, nausea and vomiting.  Genitourinary: Negative.   Musculoskeletal: Negative.   Neurological: Negative.    Physical Exam   Blood pressure 109/66, pulse 88, temperature 98.2 F (36.8 C), temperature source Oral, resp. rate 19,  height 5\' 7"  (1.702 m), weight 74.4 kg, last menstrual period 08/14/2021, SpO2 100 %.  Physical Exam Vitals and nursing note reviewed.  Constitutional:      Appearance: She is well-developed.  Cardiovascular:     Rate and Rhythm: Normal rate.  Pulmonary:     Effort: Pulmonary effort is normal.  Abdominal:     Palpations: Abdomen is soft.     Tenderness: There is no abdominal tenderness.  Skin:    General: Skin is warm and dry.  Neurological:     General: No focal deficit present.     Mental Status: She is alert and oriented to person, place, and time.  Psychiatric:        Mood and Affect: Mood normal.        Behavior: Behavior normal.    MAU Course  Procedures  MDM UA unremarkable 08/16/2021 with IUP  Assessment and Plan  [redacted] weeks gestation of pregnancy Abdominal pain  - Discharge home in stable condition  - Follow up at [facility] as scheduled.  Return to MAU as needed - Tylenol prn for pain, increase hydration   [facility], CNM 09/22/2021, 3:15 PM

## 2021-10-03 ENCOUNTER — Encounter: Payer: BC Managed Care – PPO | Admitting: Family Medicine

## 2021-10-07 ENCOUNTER — Other Ambulatory Visit (HOSPITAL_COMMUNITY)
Admission: RE | Admit: 2021-10-07 | Discharge: 2021-10-07 | Disposition: A | Discharge status: Home / Self Care | Payer: BC Managed Care – PPO | Source: Ambulatory Visit | Attending: Family Medicine | Admitting: Family Medicine

## 2021-10-07 ENCOUNTER — Other Ambulatory Visit: Payer: Self-pay

## 2021-10-07 ENCOUNTER — Ambulatory Visit (INDEPENDENT_AMBULATORY_CARE_PROVIDER_SITE_OTHER): Payer: BC Managed Care – PPO | Admitting: Family Medicine

## 2021-10-07 VITALS — BP 127/71 | HR 81 | Wt 170.4 lb

## 2021-10-07 DIAGNOSIS — O26899 Other specified pregnancy related conditions, unspecified trimester: Secondary | ICD-10-CM

## 2021-10-07 DIAGNOSIS — Z3491 Encounter for supervision of normal pregnancy, unspecified, first trimester: Secondary | ICD-10-CM | POA: Insufficient documentation

## 2021-10-07 DIAGNOSIS — Z6791 Unspecified blood type, Rh negative: Secondary | ICD-10-CM

## 2021-10-07 NOTE — Progress Notes (Signed)
Patient Name: Samantha Hardin Date of Birth: 12-10-1990 [facility] Initial Prenatal Visit  Samantha Hardin is a 30 y.o. year old [id number] at [redacted]w[redacted]d who presents for her initial prenatal visit. Pregnancy is not planned She reports fatigue, morning sickness, nausea, and headaches (relieved with rest and tylenol; worsened by lack of sleep and work); Does not currently have a headache . Does not desire medication for nausea at this time.  She is taking a prenatal vitamin.  She denies pelvic pain or vaginal bleeding.   Pregnancy Dating: The patient is dated by [place].  LMP: In beginning of October and end of october Period is certain:  No.  Periods were regular:  No.  LMP was a typical period:  No.  Using hormonal contraception in 3 months prior to conception: No  Lab Review: Blood type: AB NEG  Rh Status: - (Received rhogam 11/21) Antibody screen: Negative HIV: obtained today RPR: obtained today Hemoglobin electrophoresis reviewed: No, obtained today Results of OB urine culture are: obtained today Rubella: obtained today Hep C Ab: Not done Varicella status is Unknown  PMH: Reviewed and as detailed below: HTN: No  Gestational Hypertension/preeclampsia: No  Type 1 or 2 Diabetes: No  Depression:  Yes  Seizure disorder:  No VTE: No ,  History of STI Yes,  Abnormal Pap smear:  No, Genital herpes simplex:  No   PSH: Gynecologic Surgery: hx of c-section Surgical history reviewed, notable for: none  Obstetric History: Obstetric history tab updated and reviewed.  Summary of prior pregnancies: [id number] Cesarean delivery: Yes  Gestational Diabetes:  No Hypertension in pregnancy: No History of preterm birth: No History of LGA/SGA infant:  No History of shoulder dystocia: No Indications for referral were reviewed, and the patient has no obstetric indications for referral to [facility] at this time.   Social History: Partner's name: Ferne Reus Tobacco use:  No Alcohol use:  No Other substance use:  Yes, prior hx of marijuana use but not currently since finding out she is pregnant  Current Medications:  PNV Tylenol  Reviewed and appropriate in pregnancy.   Genetic and Infection Screen: Flow Sheet Updated No  Prenatal Exam: Gen: Well nourished, well developed.  No distress.  Vitals noted. HEENT: Normocephalic, atraumatic.  Neck supple without cervical lymphadenopathy, thyromegaly or thyroid nodules.  Fair dentition. CV: RRR no murmur, gallops or rubs Lungs: CTA B.  Normal respiratory effort without wheezes or rales. Abd: soft, NTND. +BS.  Uterus not appreciated above pelvis. GU: Normal external female genitalia without lesions.  Nl vaginal, well rugated without lesions. No vaginal discharge.   Ext: No clubbing, cyanosis or edema. Psych: Normal grooming and dress.  Not depressed or anxious appearing.  Normal thought content and process without flight of ideas or looseness of associations  Fetal heart tones:  not performed today*  Assessment/Plan:  Samantha Hardin is a 30 y.o. [phone] at [redacted]w[redacted]d who presents to initiate prenatal care. She is doing well.   Routine prenatal care: As dating is reliable, a dating ultrasound has not been ordered. Dating tab updated. Pre-pregnancy weight updated. Expected weight gain this pregnancy is 25-35 pounds  Prenatal labs ordered today. RH neg- Rhogam 11/21 Indications for referral to [facility] were reviewed and the patient does not meet criteria for referral.  Medication list reviewed and updated.  Recommended patient see a dentist for regular care.  Bleeding and pain precautions reviewed. Importance of prenatal vitamins reviewed.  Genetic screening not discussed today.  Discuss at follow up.  The patient has the following indications for aspirinto begin 81 mg at 12-16 weeks: One high risk condition: no single high risk condition  MORE than one moderate risk condition: identifies as African American   Aspirin was not  recommended today based upon above risk factors (one high risk condition or more than one moderate risk factor)  The patient will not be age 74 or over at time of delivery. Referral to genetic counseling was not offered today.  The patient has the following risk factors for preexisting diabetes: BMI > 25 and high risk ethnicity (Latino, [place] American, Native American, [place] Islander, Asian Naval architect) . An early 1 hour glucose tolerance test was not ordered. Future 1hr gtt order placed.  PHQ-9 form completed, problems noted: No  2. Pregnancy issues include the following which were addressed today:  Hx of trichomonas 09/15/21; TOC today Rh negative, rhogam given 11/21 in MAU (Cc: vaginal bleeding); will require repeat rhogam administration in third trimester  Follow up 4 weeks for next prenatal visit.

## 2021-10-07 NOTE — Patient Instructions (Signed)
Prenatal Care ?Prenatal care is health care during pregnancy. It helps you and your unborn baby (fetus) stay as healthy as possible. Prenatal care may be provided by a midwife, a family practice doctor, a mid-level practitioner (nurse practitioner or physician assistant), or a childbirth and pregnancy doctor (obstetrician). ?How does this affect me? ?During pregnancy, you will be closely monitored for any new conditions that might develop. To lower your risk of pregnancy complications, you and your health care provider will talk about any underlying conditions you have. ?How does this affect my baby? ?Early and consistent prenatal care increases the chance that your baby will be healthy during pregnancy. Prenatal care lowers the risk that your baby will be: ?Born early (prematurely). ?Smaller than expected at birth (small for gestational age). ?What can I expect at the first prenatal care visit? ?Your first prenatal care visit will likely be the longest. You should schedule your first prenatal care visit as soon as you know that you are pregnant. Your first visit is a good time to talk about any questions or concerns you have about pregnancy. ?Medical history ?At your visit, you and your health care provider will talk about your medical history, including: ?Any past pregnancies. ?Your family's medical history. ?Medical history of the baby's father. ?Any long-term (chronic) health conditions you have and how you manage them. ?Any surgeries or procedures you have had. ?Any current over-the-counter or prescription medicines, herbs, or supplements that you are taking. ?Other factors that could pose a risk to your baby, including: ?Exposure to harmful chemicals or radiation at work or at home. ?Any substance use, including tobacco, alcohol, and drug use. ?Your home setting and your stress levels, including: ?Exposure to abuse or violence. ?Household financial strain. ?Your daily health habits, including diet and  exercise. ?Tests and screenings ?Your health care provider will: ?Measure your weight, height, and blood pressure. ?Do a physical exam, including a pelvic and breast exam. ?Perform blood tests and urine tests to check for: ?Urinary tract infection. ?Sexually transmitted infections (STIs). ?Low iron levels in your blood (anemia). ?Blood type and certain proteins on red blood cells (Rh antibodies). ?Infections and immunity to viruses, such as hepatitis B and rubella. ?HIV (human immunodeficiency virus). ?Discuss your options for genetic screening. ?Tips about staying healthy ?Your health care provider will also give you information about how to keep yourself and your baby healthy, including: ?Nutrition and taking vitamins. ?Physical activity. ?How to manage pregnancy symptoms such as nausea and vomiting (morning sickness). ?Infections and substances that may be harmful to your baby and how to avoid them. ?Food safety. ?Dental care. ?Working. ?Travel. ?Warning signs to watch for and when to call your health care provider. ?How often will I have prenatal care visits? ?After your first prenatal care visit, you will have regular visits throughout your pregnancy. The visit schedule is often as follows: ?Up to week 28 of pregnancy: once every 4 weeks. ?28-36 weeks: once every 2 weeks. ?After 36 weeks: every week until delivery. ?Some women may have visits more or less often depending on any underlying health conditions and the health of the baby. ?Keep all follow-up and prenatal care visits. This is important. ?What happens during routine prenatal care visits? ?Your health care provider will: ?Measure your weight and blood pressure. ?Check for fetal heart sounds. ?Measure the height of your uterus in your abdomen (fundal height). This may be measured starting around week 20 of pregnancy. ?Check the position of your baby inside your uterus. ?Ask questions   about your diet, sleeping patterns, and whether you can feel the baby  move. ?Review warning signs to watch for and signs of labor. ?Ask about any pregnancy symptoms you are having and how you are dealing with them. Symptoms may include: ?Headaches. ?Nausea and vomiting. ?Vaginal discharge. ?Swelling. ?Fatigue. ?Constipation. ?Changes in your vision. ?Feeling persistently sad or anxious. ?Any discomfort, including back or pelvic pain. ?Bleeding or spotting. ?Make a list of questions to ask your health care provider at your routine visits. ?What tests might I have during prenatal care visits? ?You may have blood, urine, and imaging tests throughout your pregnancy, such as: ?Urine tests to check for glucose, protein, or signs of infection. ?Glucose tests to check for a form of diabetes that can develop during pregnancy (gestational diabetes mellitus). This is usually done around week 24 of pregnancy. ?Ultrasounds to check your baby's growth and development, to check for birth defects, and to check your baby's well-being. These can also help to decide when you should deliver your baby. ?A test to check for group B strep (GBS) infection. This is usually done around week 36 of pregnancy. ?Genetic testing. This may include blood, fluid, or tissue sampling, or imaging tests, such as an ultrasound. Some genetic tests are done during the first trimester and some are done during the second trimester. ?What else can I expect during prenatal care visits? ?Your health care provider may recommend getting certain vaccines during pregnancy. These may include: ?A yearly flu shot (annual influenza vaccine). This is especially important if you will be pregnant during flu season. ?Tdap (tetanus, diphtheria, pertussis) vaccine. Getting this vaccine during pregnancy can protect your baby from whooping cough (pertussis) after birth. This vaccine may be recommended between weeks 27 and 36 of pregnancy. ?A COVID-19 vaccine. ?Later in your pregnancy, your health care provider may give you information  about: ?Childbirth and breastfeeding classes. ?Choosing a health care provider for your baby. ?Umbilical cord banking. ?Breastfeeding. ?Birth control after your baby is born. ?The hospital labor and delivery unit and how to set up a tour. ?Registering at the hospital before you go into labor. ?Where to find more information ?Office on Women's Health: womenshealth.gov ?American Pregnancy Association: americanpregnancy.org ?March of Dimes: marchofdimes.org ?Summary ?Prenatal care helps you and your baby stay as healthy as possible during pregnancy. ?Your first prenatal care visit will most likely be the longest. ?You will have visits and tests throughout your pregnancy to monitor your health and your baby's health. ?Bring a list of questions to your visits to ask your health care provider. ?Make sure to keep all follow-up and prenatal care visits. ?This information is not intended to replace advice given to you by your health care provider. Make sure you discuss any questions you have with your health care provider. ?Document Revised: 07/25/2020 Document Reviewed: 07/25/2020 ?Elsevier Patient Education ? 2022 Elsevier Inc. ? ?

## 2021-10-09 LAB — AB SCR+ANTIBODY ID: Antibody Screen: POSITIVE — AB

## 2021-10-09 LAB — HCV INTERPRETATION

## 2021-10-09 LAB — OBSTETRIC PANEL, INCLUDING HIV
Basophils Absolute: 0 10*3/uL (ref 0.0–0.2)
Basos: 1 %
EOS (ABSOLUTE): 0.1 10*3/uL (ref 0.0–0.4)
Eos: 1 %
HIV Screen 4th Generation wRfx: NONREACTIVE
Hematocrit: 37.2 % (ref 34.0–46.6)
Hemoglobin: 12.5 g/dL (ref 11.1–15.9)
Hepatitis B Surface Ag: NEGATIVE
Immature Grans (Abs): 0 10*3/uL (ref 0.0–0.1)
Immature Granulocytes: 1 %
Lymphocytes Absolute: 1.4 10*3/uL (ref 0.7–3.1)
Lymphs: 24 %
MCH: 30.3 pg (ref 26.6–33.0)
MCHC: 33.6 g/dL (ref 31.5–35.7)
MCV: 90 fL (ref 79–97)
Monocytes Absolute: 0.6 10*3/uL (ref 0.1–0.9)
Monocytes: 10 %
Neutrophils Absolute: 3.9 10*3/uL (ref 1.4–7.0)
Neutrophils: 63 %
Platelets: 201 10*3/uL (ref 150–450)
RBC: 4.12 x10E6/uL (ref 3.77–5.28)
RDW: 12.7 % (ref 11.7–15.4)
RPR Ser Ql: NONREACTIVE
Rh Factor: NEGATIVE
Rubella Antibodies, IGG: 5.22 index (ref >0.99)
WBC: 6 10*3/uL (ref 3.4–10.8)

## 2021-10-09 LAB — HGB FRACTIONATION CASCADE
Hgb A2: 2.6 % (ref 1.8–3.2)
Hgb A: 96.7 % (ref 96.4–98.8)
Hgb F: 0.7 % (ref 0.0–2.0)
Hgb S: 0 %

## 2021-10-09 LAB — VARICELLA ZOSTER ANTIBODY, IGG: Varicella zoster IgG: 2802 index (ref >165)

## 2021-10-09 LAB — CULTURE, OB URINE

## 2021-10-09 LAB — HCV AB W REFLEX TO QUANT PCR: HCV Ab: <0.1 s/co ratio (ref 0.0–0.9)

## 2021-10-09 LAB — URINE CULTURE, OB REFLEX

## 2021-10-10 DIAGNOSIS — Z3491 Encounter for supervision of normal pregnancy, unspecified, first trimester: Secondary | ICD-10-CM | POA: Insufficient documentation

## 2021-10-13 LAB — CYTOLOGY - PAP
Chlamydia: NEGATIVE
Comment: NEGATIVE
Comment: NEGATIVE
Comment: NEGATIVE
Comment: NORMAL
Diagnosis: NEGATIVE
High risk HPV: NEGATIVE
Neisseria Gonorrhea: NEGATIVE
Trichomonas: NEGATIVE

## 2021-10-17 ENCOUNTER — Encounter (HOSPITAL_COMMUNITY): Payer: Self-pay | Admitting: Obstetrics and Gynecology

## 2021-10-17 ENCOUNTER — Other Ambulatory Visit: Payer: Self-pay

## 2021-10-17 ENCOUNTER — Inpatient Hospital Stay (HOSPITAL_COMMUNITY)
Admission: AD | Admit: 2021-10-17 | Discharge: 2021-10-17 | Disposition: A | Discharge status: Home / Self Care | Payer: BC Managed Care – PPO | Attending: Obstetrics and Gynecology | Admitting: Obstetrics and Gynecology

## 2021-10-17 DIAGNOSIS — R197 Diarrhea, unspecified: Secondary | ICD-10-CM | POA: Diagnosis not present

## 2021-10-17 DIAGNOSIS — O219 Vomiting of pregnancy, unspecified: Secondary | ICD-10-CM | POA: Diagnosis not present

## 2021-10-17 DIAGNOSIS — O26891 Other specified pregnancy related conditions, first trimester: Secondary | ICD-10-CM | POA: Insufficient documentation

## 2021-10-17 DIAGNOSIS — Z3A09 9 weeks gestation of pregnancy: Secondary | ICD-10-CM | POA: Insufficient documentation

## 2021-10-17 LAB — URINALYSIS, ROUTINE W REFLEX MICROSCOPIC
Bilirubin Urine: NEGATIVE
Glucose, UA: NEGATIVE mg/dL
Hgb urine dipstick: NEGATIVE
Ketones, ur: NEGATIVE mg/dL
Nitrite: NEGATIVE
Protein, ur: NEGATIVE mg/dL
Specific Gravity, Urine: 1.025 (ref 1.005–1.030)
pH: 6 (ref 5.0–8.0)

## 2021-10-17 MED ORDER — LOPERAMIDE HCL 2 MG PO CAPS
2.0000 mg | ORAL_CAPSULE | Freq: Once | ORAL | Status: AC
  Administered 2021-10-17: 07:00:00 2 mg via ORAL
  Filled 2021-10-17: qty 1

## 2021-10-17 MED ORDER — METOCLOPRAMIDE HCL 10 MG PO TABS: 10.0000 mg | ORAL_TABLET | Freq: Four times a day (QID) | ORAL | 0 refills | Status: DC

## 2021-10-17 MED ORDER — METOCLOPRAMIDE HCL 10 MG PO TABS
10.0000 mg | ORAL_TABLET | Freq: Once | ORAL | Status: AC
  Administered 2021-10-17: 07:00:00 10 mg via ORAL
  Filled 2021-10-17: qty 1

## 2021-10-17 NOTE — MAU Provider Note (Signed)
History     CSN: [id number]  Arrival date and time: 10/17/21 0531   None     Chief Complaint  Patient presents with   Emesis   Diarrhea   HPI Samantha Hardin is a 30 y.o [id number] at [redacted]w[redacted]d who presents to MAU for nausea, vomiting, and diarrhea. Patient reports symptoms started 3 days ago. Symptoms started after she ate some chili. Reports her boyfriend also ate the chili, but is not sick. She is unable to keep anything down. Reports that she has vomited approximately 6-7 times in the past 24 hours, last time she vomited was at 3am. She reports that her stools are loose, not watery. She has not taken any medications at home to relieve symptoms. She denies abdominal pain, vaginal bleeding, fever, chills, or urinary s/s. No recent sick contacts to her knowledge although she does had a 57 year old son who is in kindergarten.   OB History     Gravida  4   Para  1   Term  1   Preterm  0   AB  2   Living  1      SAB  0   IAB  2   Ectopic  0   Multiple  0   Live Births  1           Past Medical History:  Diagnosis Date   Anemia    Asthma    Depression    never on meds, doing ok   Headache(784.0)    when on birth control   Heart murmur    Migraines    Trichomonas infection     Past Surgical History:  Procedure Laterality Date   CESAREAN SECTION N/A 09/14/2016   Procedure: CESAREAN SECTION;  Surgeon: Levie Heritage, DO;  Location: [facility] BIRTHING SUITES;  Service: Obstetrics;  Laterality: N/A;   INDUCED ABORTION     PERINEAL LACERATION REPAIR N/A 09/14/2016   Procedure: Larene Pickett BALLOON PLACEMENT;  Surgeon: Levie Heritage, DO;  Location: [facility] BIRTHING SUITES;  Service: Obstetrics;  Laterality: N/A;    Family History  Problem Relation Age of Onset   Heart disease Maternal Grandfather    Diabetes Maternal Grandfather    Hypertension Maternal Grandfather    Healthy Mother    Healthy Father    Anesthesia problems Neg Hx     Social History   Tobacco Use    Smoking status: Former    Packs/day: 0.25    Types: Cigarettes, Cigars    Quit date: 09/15/2021    Years since quitting: 0.0   Smokeless tobacco: Never  Vaping Use   Vaping Use: Never used  Substance Use Topics   Alcohol use: Not Currently    Comment: Infrequently.   Drug use: Not Currently    Types: Marijuana    Comment: 11/21, not since found out    Allergies:  Allergies  Allergen Reactions   Latex Swelling and Rash    Medications Prior to Admission  Medication Sig Dispense Refill Last Dose   Prenatal Vit-Fe Fumarate-FA (PREPLUS) 27-1 MG TABS Take 1 tablet by mouth daily. 30 tablet 13 Past Week   albuterol (VENTOLIN HFA) 108 (90 Base) MCG/ACT inhaler Inhale 2 puffs into the lungs every 6 (six) hours as needed for wheezing or shortness of breath. 1 each 1 More than a month    Review of Systems  Constitutional: Negative.   Respiratory: Negative.    Cardiovascular: Negative.   Gastrointestinal:  Positive for diarrhea, nausea  and vomiting.  Genitourinary: Negative.   Musculoskeletal: Negative.   Neurological: Negative.    Physical Exam   Blood pressure 112/68, pulse 72, temperature 98.3 F (36.8 C), temperature source Oral, resp. rate 17, height 5\' 7"  (1.702 m), weight 74.1 kg, last menstrual period 08/14/2021, SpO2 99 %.  Physical Exam Vitals and nursing note reviewed.  Constitutional:      General: She is not in acute distress.    Appearance: She is not ill-appearing.  Eyes:     Pupils: Pupils are equal, round, and reactive to light.  Cardiovascular:     Rate and Rhythm: Normal rate.  Pulmonary:     Effort: Pulmonary effort is normal.  Abdominal:     Palpations: Abdomen is soft.     Tenderness: There is no abdominal tenderness.  Musculoskeletal:        General: Normal range of motion.     Cervical back: Normal range of motion.  Skin:    General: Skin is warm and dry.  Neurological:     General: No focal deficit present.     Mental Status: She is alert  and oriented to person, place, and time.  Psychiatric:        Mood and Affect: Mood normal.        Behavior: Behavior normal.        Thought Content: Thought content normal.        Judgment: Judgment normal.    MAU Course  Procedures UA Reglan and Imodium PO   MDM UA unremarkable Reglan and Imodium PO given No vomiting or diarrhea episodes during stay   Assessment and Plan  [redacted] weeks gestation of pregnancy Nausea and vomiting affecting pregnancy  - Discharge home in stable condition - Reglan sent to pharmacy - Strict return precautions. Return to MAU as needed or for worsening symptoms - Keep OB appointment as scheduled    08/16/2021, CNM 10/17/2021, 8:25 AM

## 2021-10-17 NOTE — MAU Note (Signed)
..  Samantha Hardin is a 30 y.o. at [redacted]w[redacted]d here in MAU reporting: For three days she has been vomiting and has had diarrhea. Reports she threw up 7 times in the past 24 hours. Has not taken anything for the diarrhea and vomiting.  Denies vaginal bleeding or leaking of fluid.  Pain score: 0/10 Vitals:   10/17/21 0600 10/17/21 0603  BP:  112/68  Pulse:  72  Resp:  17  Temp:  98.3 F (36.8 C)  SpO2: 99%       Lab orders placed from triage: UA

## 2021-10-17 NOTE — Discharge Instructions (Signed)

## 2021-10-29 ENCOUNTER — Inpatient Hospital Stay (HOSPITAL_COMMUNITY)
Admission: AD | Admit: 2021-10-29 | Discharge: 2021-10-29 | Disposition: A | Discharge status: Home / Self Care | Payer: BC Managed Care – PPO | Source: Ambulatory Visit | Attending: Obstetrics and Gynecology | Admitting: Obstetrics and Gynecology

## 2021-10-29 ENCOUNTER — Telehealth: Payer: Self-pay

## 2021-10-29 ENCOUNTER — Other Ambulatory Visit: Payer: Self-pay

## 2021-10-29 ENCOUNTER — Inpatient Hospital Stay (HOSPITAL_COMMUNITY): Payer: BC Managed Care – PPO

## 2021-10-29 ENCOUNTER — Encounter (HOSPITAL_COMMUNITY): Payer: Self-pay | Admitting: Obstetrics and Gynecology

## 2021-10-29 DIAGNOSIS — Z3A11 11 weeks gestation of pregnancy: Secondary | ICD-10-CM | POA: Diagnosis not present

## 2021-10-29 DIAGNOSIS — O26891 Other specified pregnancy related conditions, first trimester: Secondary | ICD-10-CM | POA: Diagnosis not present

## 2021-10-29 DIAGNOSIS — M549 Dorsalgia, unspecified: Secondary | ICD-10-CM | POA: Insufficient documentation

## 2021-10-29 DIAGNOSIS — O26851 Spotting complicating pregnancy, first trimester: Secondary | ICD-10-CM | POA: Insufficient documentation

## 2021-10-29 DIAGNOSIS — Z3A01 Less than 8 weeks gestation of pregnancy: Secondary | ICD-10-CM | POA: Diagnosis not present

## 2021-10-29 DIAGNOSIS — Z6791 Unspecified blood type, Rh negative: Secondary | ICD-10-CM | POA: Diagnosis not present

## 2021-10-29 DIAGNOSIS — O26899 Other specified pregnancy related conditions, unspecified trimester: Secondary | ICD-10-CM

## 2021-10-29 DIAGNOSIS — O209 Hemorrhage in early pregnancy, unspecified: Secondary | ICD-10-CM | POA: Diagnosis not present

## 2021-10-29 DIAGNOSIS — O039 Complete or unspecified spontaneous abortion without complication: Secondary | ICD-10-CM | POA: Insufficient documentation

## 2021-10-29 LAB — URINALYSIS, ROUTINE W REFLEX MICROSCOPIC
Bilirubin Urine: NEGATIVE
Glucose, UA: NEGATIVE mg/dL
Ketones, ur: 5 mg/dL — AB
Leukocytes,Ua: NEGATIVE
Nitrite: NEGATIVE
Protein, ur: NEGATIVE mg/dL
Specific Gravity, Urine: 1.033 — ABNORMAL HIGH (ref 1.005–1.030)
pH: 5 (ref 5.0–8.0)

## 2021-10-29 MED ORDER — MISOPROSTOL 200 MCG PO TABS: 800.0000 ug | ORAL_TABLET | Freq: Once | ORAL | 0 refills | Status: DC

## 2021-10-29 MED ORDER — HYDROCODONE-ACETAMINOPHEN 5-325 MG PO TABS: 1.0000 | ORAL_TABLET | Freq: Four times a day (QID) | ORAL | 0 refills | Status: DC | PRN

## 2021-10-29 MED ORDER — IBUPROFEN 600 MG PO TABS: 600.0000 mg | ORAL_TABLET | Freq: Four times a day (QID) | ORAL | 0 refills | Status: DC | PRN

## 2021-10-29 MED ORDER — RHO D IMMUNE GLOBULIN 1500 UNIT/2ML IJ SOSY
300.0000 ug | PREFILLED_SYRINGE | Freq: Once | INTRAMUSCULAR | Status: DC
  Filled 2021-10-29: qty 2

## 2021-10-29 NOTE — Telephone Encounter (Signed)
Patient calls nurse line regarding vaginal bleeding in first trimester. Patient reports that bleeding began on Friday and has been intermittent. However, reports that she has noticed an increase in vaginal bleeding over the last 24 hours. Also endorses slight abdominal pain and cramping. Denies lightheadedness of dizziness.   Advised that patient be evaluated in MAU due to increased bleeding and abdominal pain. Patient verbalizes understanding.   Veronda Prude, RN

## 2021-10-29 NOTE — MAU Provider Note (Addendum)
History     CSN: [id number]  Arrival date and time: 10/29/21 1723   Event Date/Time   First Provider Initiated Contact with Patient 10/29/21 1837      Chief Complaint  Patient presents with   Vaginal Bleeding   Back Pain   HPI  Ms.TASMA RAYBON is a 31 y.o. female G4 P1 at 11 weeks 3 days here with complaints of onset of bleeding that started 3 days ago. It started off as dark red spotting and then went away.  This morning at 9 AM she thought she urinated on herself, she used the bathroom and noted an increased amount of dark brown-red blood.  She laid down and took 1 Tylenol and when she used the bathroom after she noted a few small clots.  She has no abdominal pain however does report bilateral lower back pain.  OB History     Gravida  4   Para  1   Term  1   Preterm  0   AB  2   Living  1      SAB  0   IAB  2   Ectopic  0   Multiple  0   Live Births  1           Past Medical History:  Diagnosis Date   Anemia    Asthma    Depression    never on meds, doing ok   Headache(784.0)    when on birth control   Heart murmur    Migraines    Trichomonas infection     Past Surgical History:  Procedure Laterality Date   CESAREAN SECTION N/A 09/14/2016   Procedure: CESAREAN SECTION;  Surgeon: Truett Mainland, DO;  Location: [facility];  Service: Obstetrics;  Laterality: N/A;   INDUCED ABORTION     PERINEAL LACERATION REPAIR N/A 09/14/2016   Procedure: Adalberto Ill BALLOON PLACEMENT;  Surgeon: Truett Mainland, DO;  Location: [place];  Service: Obstetrics;  Laterality: N/A;    Family History  Problem Relation Age of Onset   Heart disease Maternal Grandfather    Diabetes Maternal Grandfather    Hypertension Maternal Grandfather    Healthy Mother    Healthy Father    Anesthesia problems Neg Hx     Social History   Tobacco Use   Smoking status: Former    Packs/day: 0.25    Types: Cigarettes, Cigars    Quit date: 09/15/2021     Years since quitting: 0.1   Smokeless tobacco: Never  Vaping Use   Vaping Use: Never used  Substance Use Topics   Alcohol use: Not Currently    Comment: Infrequently.   Drug use: Not Currently    Types: Marijuana    Comment: 11/21, not since found out    Allergies:  Allergies  Allergen Reactions   Latex Swelling and Rash    Medications Prior to Admission  Medication Sig Dispense Refill Last Dose   albuterol (VENTOLIN HFA) 108 (90 Base) MCG/ACT inhaler Inhale 2 puffs into the lungs every 6 (six) hours as needed for wheezing or shortness of breath. 1 each 1 Past Month   Prenatal Vit-Fe Fumarate-FA (PREPLUS) 27-1 MG TABS Take 1 tablet by mouth daily. 30 tablet 13 Past Week   metoCLOPramide (REGLAN) 10 MG tablet Take 1 tablet (10 mg total) by mouth every 6 (six) hours. 30 tablet 0 Unknown   Results for orders placed or performed during the hospital encounter of 10/29/21 (from  the past 48 hour(s))  Urinalysis, Routine w reflex microscopic Urine, Clean Catch     Status: Abnormal   Collection Time: 10/29/21  6:09 PM  Result Value Ref Range   Color, Urine YELLOW YELLOW   APPearance CLEAR CLEAR   Specific Gravity, Urine 1.033 (H) 1.005 - 1.030   pH 5.0 5.0 - 8.0   Glucose, UA NEGATIVE NEGATIVE mg/dL   Hgb urine dipstick MODERATE (A) NEGATIVE   Bilirubin Urine NEGATIVE NEGATIVE   Ketones, ur 5 (A) NEGATIVE mg/dL   Protein, ur NEGATIVE NEGATIVE mg/dL   Nitrite NEGATIVE NEGATIVE   Leukocytes,Ua NEGATIVE NEGATIVE   RBC / HPF 0-5 0 - 5 RBC/hpf   WBC, UA 0-5 0 - 5 WBC/hpf   Bacteria, UA RARE (A) NONE SEEN   Squamous Epithelial / LPF 0-5 0 - 5   Mucus PRESENT     Comment: Performed at [facility], [address] [place], [place] [address]  Rh IG workup (includes ABO/Rh)     Status: None (Preliminary result)   Collection Time: 10/29/21  7:45 PM  Result Value Ref Range   Gestational Age(Wks) 11    ABO/RH(D) AB NEG    Antibody Screen POS    Antibody Identification PASSIVELY  ACQUIRED ANTI-D    Unit Number [id number]    Blood Component Type RHIG    Unit division 00    Status of Unit ALLOCATED    Transfusion Status      OK TO TRANSFUSE Performed at [facility] [address], [place], [place] [address]    [place] Transvaginal  Result Date: 10/29/2021 CLINICAL DATA:  Vaginal bleeding, pregnant, LMP 08/14/2021. EXAM: TRANSVAGINAL OB ULTRASOUND TECHNIQUE: Transvaginal ultrasound was performed for complete evaluation of the gestation as well as the maternal uterus, adnexal regions, and pelvic cul-de-sac. COMPARISON:  09/22/2021 FINDINGS: Intrauterine gestational sac: Single Yolk sac:  Visualized. Embryo:  Visualized. Cardiac Activity: Not Visualized. Heart Rate:   Not applicable MSD: Appropriate given fetal size CRL:   8 mm   6 w 5 d                  [place] EDC: Not applicable Subchorionic hemorrhage:  None visualized. Maternal uterus/adnexae: The uterus is anteverted. No intrauterine masses are seen. The cervix is closed and is unremarkable. There is no free fluid within the cul-de-sac. The maternal ovaries are unremarkable. IMPRESSION: Findings meet definitive criteria for failed pregnancy. This follows SRU consensus guidelines: Diagnostic Criteria for Nonviable Pregnancy Early in the First Trimester. Alison Stalling J Med [phone]. Electronically Signed   By: Fidela Salisbury M.D.   On: 10/29/2021 19:41      Review of Systems  Constitutional:  Negative for fever.  Gastrointestinal:  Negative for abdominal pain.  Genitourinary:  Positive for vaginal bleeding and vaginal discharge.  Musculoskeletal:  Positive for back pain.  Physical Exam   Blood pressure 115/70, pulse 74, temperature 98.2 F (36.8 C), temperature source Oral, resp. rate 17, last menstrual period 08/14/2021, SpO2 99 %.  Physical Exam Constitutional:      General: She is not in acute distress.    Appearance: Normal appearance. She is not ill-appearing, toxic-appearing or diaphoretic.  HENT:      Head: Normocephalic.  Genitourinary:    Comments: Cervix closed, thick, posterior. Small amount of dark red blood on exam glove. Exam by Noni Saupe, NP  Skin:    General: Skin is warm.  Neurological:     Mental Status: She  is alert and oriented to person, place, and time.  Psychiatric:        Behavior: Behavior normal.    MAU Course  Procedures  Pt informed that the ultrasound is considered a limited OB ultrasound and is not intended to be a complete ultrasound exam.  Patient also informed that the ultrasound is not being completed with the intent of assessing for fetal or placental anomalies or any pelvic abnormalities.  Explained that the purpose of todays ultrasound is to assess for  viability.  Patient acknowledges the purpose of the exam and the limitations of the study.   No fetal heart tones noted. Official US ordered   MDM  AB negative: Rhogam given 09/15/21 US show incomplete SAB She was counseled on options for management--observation, medical treatment with Cytotec,  or D&E.  She elected to proceed with cytotec  Hgb 12.5 on 12/13  Assessment and Plan   A:  1. SAB (spontaneous abortion)   2. Vaginal bleeding in pregnancy, first trimester   3. Rh negative, antepartum      P:  Discharge home Return to MAU if symptoms worsen Bleeding precautions F/u with [facility]. Message sent.  Rx: Cytotec, Ibuprofen, Vicodin Support given.  Lezlie Lye, NP 10/29/2021 9:13 PM

## 2021-10-29 NOTE — MAU Note (Signed)
Been spotting for a couple days now, started on Friday. went away over the weekend, but when she was at work today, she felt wet.  Went to the bathroom, was like a 3" circle in underwear. Went home showered, has now had some dk red spotting.  Having pressure in her lower back.

## 2021-10-30 ENCOUNTER — Telehealth: Payer: Self-pay | Admitting: Family Medicine

## 2021-10-30 NOTE — Telephone Encounter (Signed)
FMLA form dropped off form at front desk for completion..  Verified that patient section of form has been completed.  Last DOS/WCC with PCP was 10/07/21.  Placed form in red team folder to be completed by clinical staff.  Grayce Fredda Hammed

## 2021-10-31 LAB — RH IG WORKUP (INCLUDES ABO/RH)
ABO/RH(D): AB NEG
Antibody Screen: POSITIVE
Gestational Age(Wks): 11
Unit division: 0

## 2021-10-31 NOTE — Telephone Encounter (Signed)
Reviewed form and placed in PCP's box for completion.  .Lamesha Tibbits R Lailana Shira, CMA  

## 2021-11-01 ENCOUNTER — Other Ambulatory Visit: Payer: Self-pay | Admitting: Student

## 2021-11-01 DIAGNOSIS — O039 Complete or unspecified spontaneous abortion without complication: Secondary | ICD-10-CM

## 2021-11-01 MED ORDER — MISOPROSTOL 200 MCG PO TABS: 800.0000 ug | ORAL_TABLET | Freq: Once | ORAL | 0 refills | Status: DC

## 2021-11-01 NOTE — Progress Notes (Signed)
Patient called in stating she does not think she completed her miscarriage after one dose of cytotec due to minimal increase in bleeding. Will send in second dose of cytotec. Discussed precautions & reasons to return to MAU. Provided with work excuse which will be left at the security desk for the patient. She already has scheduled follow up.   Judeth Horn, NP

## 2021-11-03 ENCOUNTER — Telehealth: Payer: Self-pay | Admitting: Family Medicine

## 2021-11-03 NOTE — Telephone Encounter (Signed)
Discussed FMLA paperwork for returning to work 1/23 with patient. She agrees with this plan. OB follow up 1/17. She will plan to update office if the return date needs to change.   Lavonda Jumbo, DO 11/03/2021, 2:38 PM PGY-3, [facility]

## 2021-11-03 NOTE — Telephone Encounter (Addendum)
Entered documentation in phone note from 10/30/2021.  Veronda Prude, RN

## 2021-11-03 NOTE — Telephone Encounter (Signed)
Patient dropped off form at front desk for [facility].  Verified that patient section of form has been completed.  Last DOS/WCC with PCP was 10/07/21.  Placed form in red team folder to be completed by clinical staff.  Creig Hines

## 2021-11-03 NOTE — Telephone Encounter (Signed)
Form filled out placed in triage box.   Aman Batley Autry-Lott, DO 11/03/2021, 1:34 PM PGY-3, [place]

## 2021-11-05 NOTE — Telephone Encounter (Signed)
Patient returns call to office today to discuss forms. Return to work certificate was dropped off on 11/03/21 and is in provider's box for completion.   Advised patient that we would refax FMLA paperwork from 1/5 encounter, as patient is unsure if they received it.   Advised patient that we would contact her when this set of forms has been completed.   Veronda Prude, RN

## 2021-11-05 NOTE — Telephone Encounter (Signed)
Faxed paperwork to [phone]. Release of records signed and was scanned into chart on 11/03/21.  Veronda Prude, RN

## 2021-11-07 NOTE — Telephone Encounter (Signed)
Forms faxed to [facility] and a copy was made for batch scanning.

## 2021-11-11 ENCOUNTER — Other Ambulatory Visit: Payer: Self-pay

## 2021-11-11 ENCOUNTER — Ambulatory Visit (INDEPENDENT_AMBULATORY_CARE_PROVIDER_SITE_OTHER): Payer: BC Managed Care – PPO | Admitting: Family Medicine

## 2021-11-11 ENCOUNTER — Encounter: Payer: Self-pay | Admitting: Family Medicine

## 2021-11-11 VITALS — BP 139/85 | HR 74 | Wt 165.0 lb

## 2021-11-11 DIAGNOSIS — R519 Headache, unspecified: Secondary | ICD-10-CM | POA: Diagnosis not present

## 2021-11-11 DIAGNOSIS — R030 Elevated blood-pressure reading, without diagnosis of hypertension: Secondary | ICD-10-CM | POA: Diagnosis not present

## 2021-11-11 DIAGNOSIS — Z8759 Personal history of other complications of pregnancy, childbirth and the puerperium: Secondary | ICD-10-CM | POA: Diagnosis not present

## 2021-11-11 NOTE — Patient Instructions (Addendum)
Thank you for coming in today.  We discussed following up on the blood work when available.  You are able to return to work as discussed.  Please let us know if we can assist you any further.  Follow-up as needed.  Dr. Salvadore Dom

## 2021-11-11 NOTE — Progress Notes (Signed)
° ° °  SUBJECTIVE:   CHIEF COMPLAINT / HPI:   [name] is a 31 yo F who presents for the below.   Lab work, Hx of SAB Feels as if she has passed all contents of conception. She endorses spotting. Denies heavy vaginal bleeding, fever, and pelvic pain. Her job has received her FMLA paperwork and she plans to return to work 1/23. This was her first time attempting to conceive with a new partner. She has 1 child with a prior different partner. Her previous partner does not have any children. She is concerned this will happen again.   Headaches Mentioned after visit ended as patient was being escorted to the lab.  She endorses waking up with a headache every morning for the past week. Denies hx of migraine headaches, nausea, vomiting.   PERTINENT  PMH / PSH: Migraine headaches, asthma, anemia, depression  OBJECTIVE:   BP 139/85    Pulse 74    Wt 165 lb (74.8 kg)    LMP 08/14/2021    SpO2 100%    Breastfeeding Unknown    BMI 25.84 kg/m   Flowsheet Row Office Visit from 11/11/2021 in [facility]  PHQ-9 Total Score 0      Physical Exam Vitals reviewed.  Constitutional:      General: She is not in acute distress.    Appearance: She is not ill-appearing.  Pulmonary:     Effort: Pulmonary effort is normal.  Neurological:     Mental Status: She is alert and oriented to person, place, and time.  Psychiatric:        Mood and Affect: Mood normal.        Behavior: Behavior normal.   ASSESSMENT/PLAN:   1. History of spontaneous abortion SAB 1/4; prior IUP with documented normal HR. Reviewed 1/4, 12/23, 11/28, 11/21 MAU visits. Given Cytotec for medical management 1/4. Rhogam given 11/21. Here today for bHCG. Endorsing spotting without pelvic pain or fever. Discussed how SAB are common and the likelihood to go on to have a normal subsequent pregnancy. Discussed having a few normal cycles before attempting to conceive again. Patient feels emotionally and physically ready to  return to work 1/23. Will repeat bHCG today and consider HPT in 2 weeks if considerably elevated.  - Beta hCG quant (ref lab)  2. Nonintractable headache, unspecified chronicity pattern, unspecified headache type Hx of migraine headaches. Mentioned after visit had ended. Denies nausea and vomiting with morning headaches. Discussed red flags and when to go to ED such as vision changes, chest pain, worst headache of your life. Discussed having follow up this week especially with elevated BP. Patient voiced understanding. Scheduled follow up with Dr. Idalia Needle 11/18/21. Would suggest discussing caffeine intake, stress level, sleep quality.   3. Elevated blood pressure reading No prior hx of HTN. Chart review of previous vital and she is usually normotensive. Follow up at 1/24 visit. Could be source of headache.   Lavonda Jumbo, DO [facility]

## 2021-11-12 DIAGNOSIS — Z20822 Contact with and (suspected) exposure to covid-19: Secondary | ICD-10-CM | POA: Diagnosis not present

## 2021-11-12 DIAGNOSIS — Z03818 Encounter for observation for suspected exposure to other biological agents ruled out: Secondary | ICD-10-CM | POA: Diagnosis not present

## 2021-11-12 LAB — BETA HCG QUANT (REF LAB): hCG Quant: 10 m[IU]/mL

## 2021-11-18 ENCOUNTER — Ambulatory Visit: Payer: BC Managed Care – PPO | Admitting: Family Medicine

## 2021-11-20 NOTE — Telephone Encounter (Signed)
Patient is calling to let Dr. Salvadore Dom know that Samantha Hardin is resending FMLA paper work due to missing the patients due date of 07/27. She said there was also another date that was incorrect. They sent it yesterday and will be sending something else to day explaining in more detail of what needs to be corrected.   Please call patient with any questions. [phone].

## 2021-11-24 NOTE — Telephone Encounter (Signed)
Form filled out and placed in to be faxed pile.   Gerlene Fee, DO 11/24/2021, 5:27 PM PGY-3, [place]

## 2021-12-04 NOTE — Telephone Encounter (Signed)
Patient is calling to check on status of new FMLA paperwork being completed. There was a new form sent that is different than original. It has 16 questions that needs to be completed. The original was the 8 questions.   Form was received and placed in Dr. Jeralene Peters box. Patient states both forms still need to be completed and faxed back together.   She left two fax numbers to try since they were not receiving original form.  [phone]  Please call patient and let her know when forms have been completed and refaxed.

## 2021-12-16 ENCOUNTER — Telehealth: Payer: Self-pay

## 2021-12-16 NOTE — Telephone Encounter (Signed)
Spoke with pt and pt HR. FMLA has been denied due to the paperwork does not support inability to do job duties or functions. Pt is asking Dr. Salvadore Dom to write a letter saying why FMLA was needed by the patient (complications, bed rest needed, medications that are taken will not allow her to perform her duties.)  Please fax the letter to: [phone] and include the following: CASE # [id number] Win # [id number].  Call pt with questions and or when the paperwork has been faxed.  Sunday Spillers, CMA

## 2021-12-26 ENCOUNTER — Telehealth: Payer: Self-pay

## 2021-12-26 NOTE — Telephone Encounter (Signed)
Patient calls nurse line reporting her FMLA was denied.  ? ?Patient reports PCP needs to call HR to give more detail on patients case.  ? ?[phone]- ask for an examiner.  ?

## 2021-12-29 NOTE — Telephone Encounter (Signed)
Spoke with patient. She states they need all date pertaining to her pregnancy and miscarriage and medications as well as her state of not being able to work during this time. Her FMLA paper work was approved but not disability. She will resend to the office for me to fill out for reprocessing. In the meantime discussed written consent would need to be given to speak with HR. She voiced understanding and plans to have them send the forms.  ? ?Lavonda Jumbo, DO ?12/29/2021, 11:30 AM ?PGY-3,  Family Medicine ? ?

## 2022-03-31 ENCOUNTER — Encounter: Payer: Self-pay | Admitting: *Deleted

## 2022-05-06 DIAGNOSIS — J069 Acute upper respiratory infection, unspecified: Secondary | ICD-10-CM | POA: Diagnosis not present

## 2022-05-06 DIAGNOSIS — R059 Cough, unspecified: Secondary | ICD-10-CM | POA: Diagnosis not present

## 2022-05-06 DIAGNOSIS — Z20822 Contact with and (suspected) exposure to covid-19: Secondary | ICD-10-CM | POA: Diagnosis not present

## 2022-05-06 DIAGNOSIS — R0602 Shortness of breath: Secondary | ICD-10-CM | POA: Diagnosis not present

## 2022-09-14 IMAGING — US US OB TRANSVAGINAL
1 series · 15 of 28 positions shown · non-contrast
Comparison: 09/22/2021

CLINICAL DATA: Vaginal bleeding, pregnant, LMP 08/14/2021.

EXAM:
TRANSVAGINAL OB ULTRASOUND
TECHNIQUE: Transvaginal ultrasound was performed for complete evaluation of the
gestation as well as the maternal uterus, adnexal regions, and
pelvic cul-de-sac.

[Series 1: us ob transvaginal · 45 acquisitions, 15 frames shown]
[im 1/45]
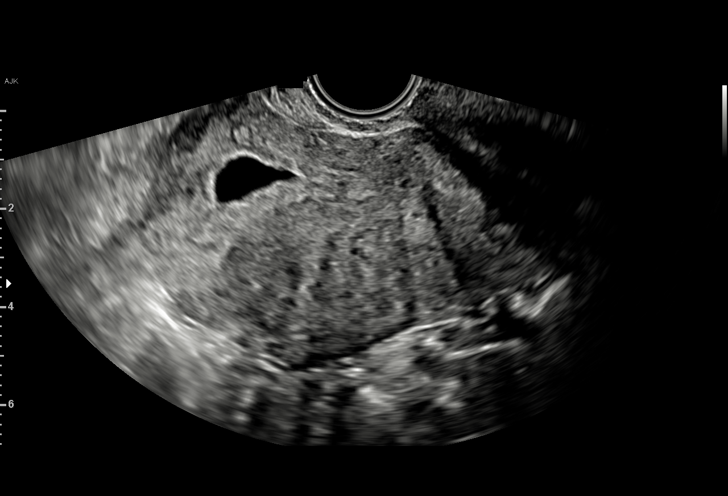
[im 4/45]
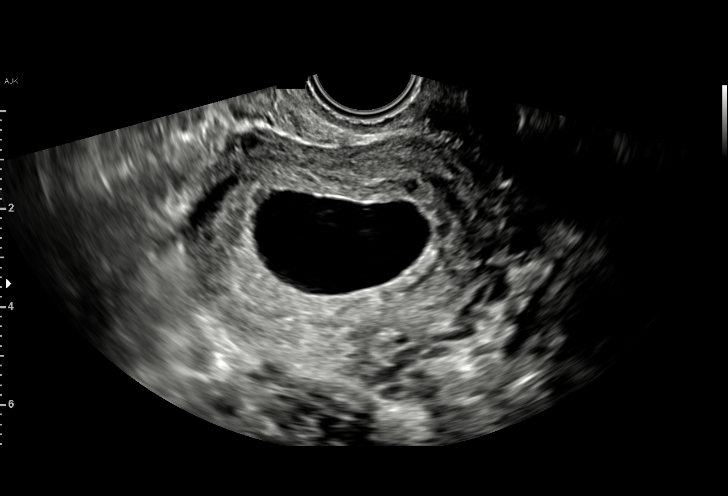
[im 7/45]
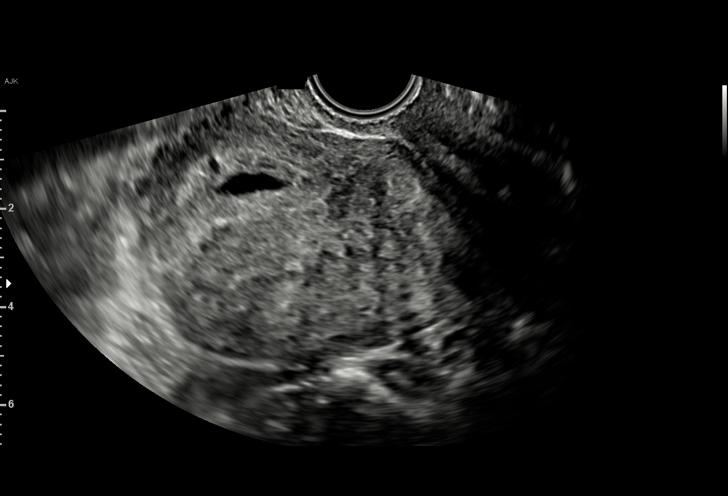
[im 10/45]
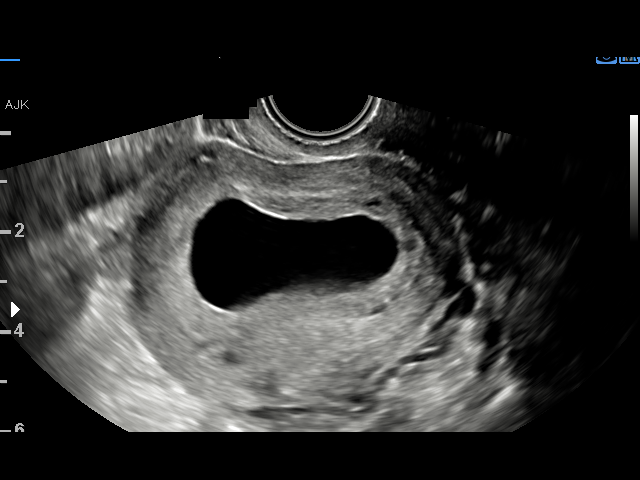
[im 14/45]
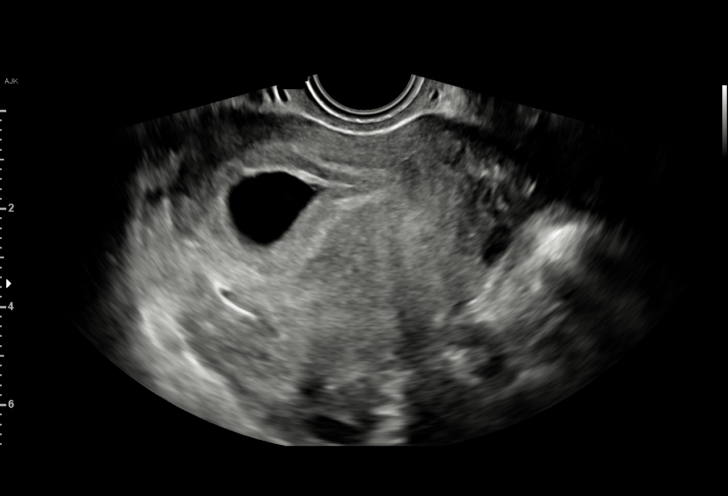
[im 17/45]
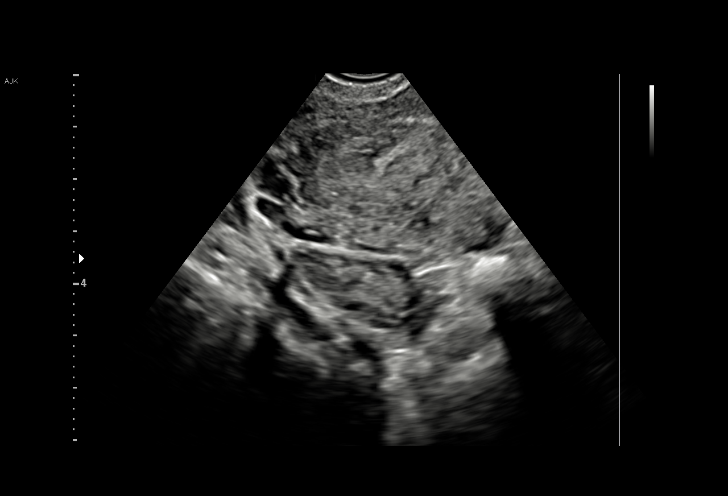
[im 20/45]
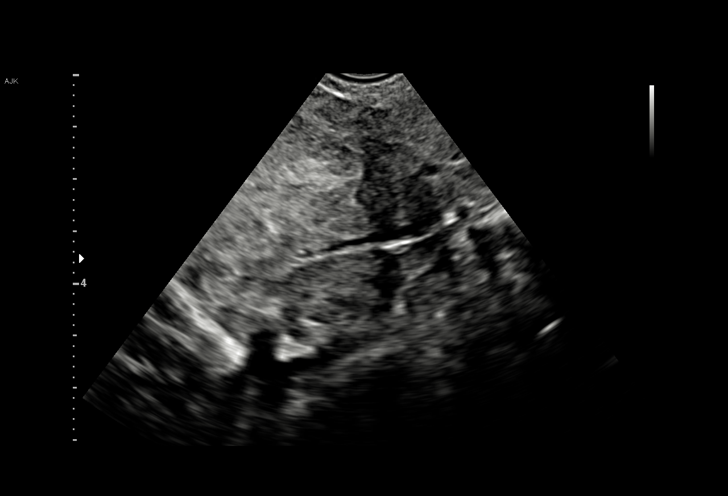
[im 23/45]
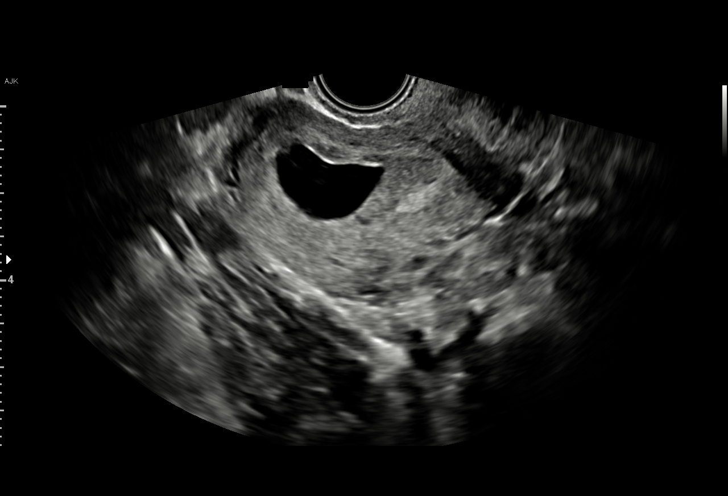
[im 25/45]
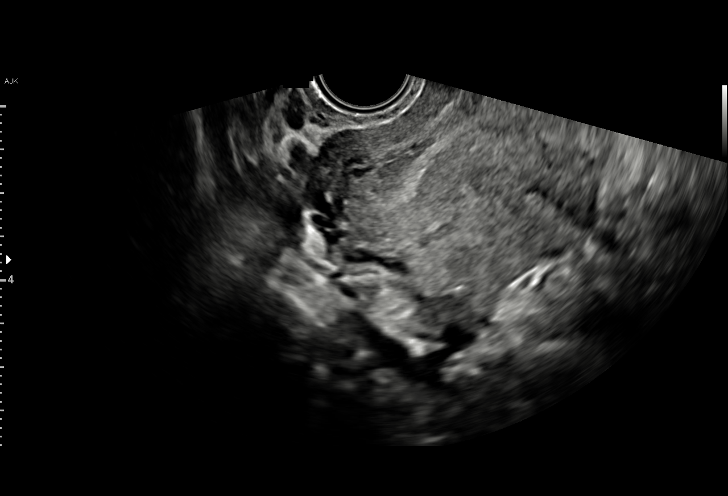
[im 28/45]
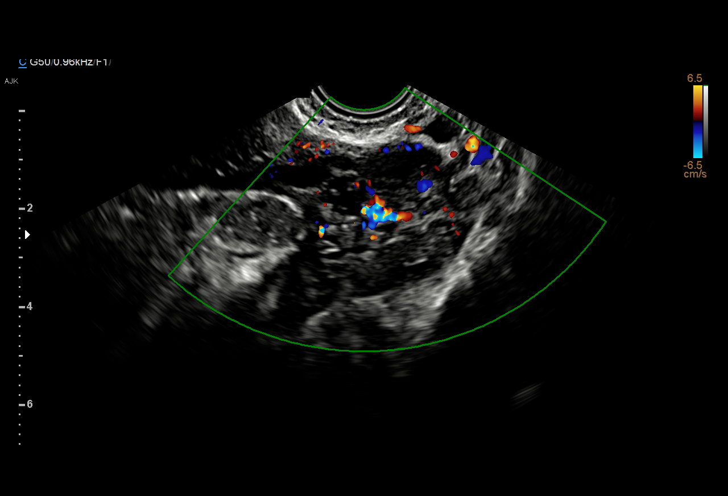
[im 31/45]
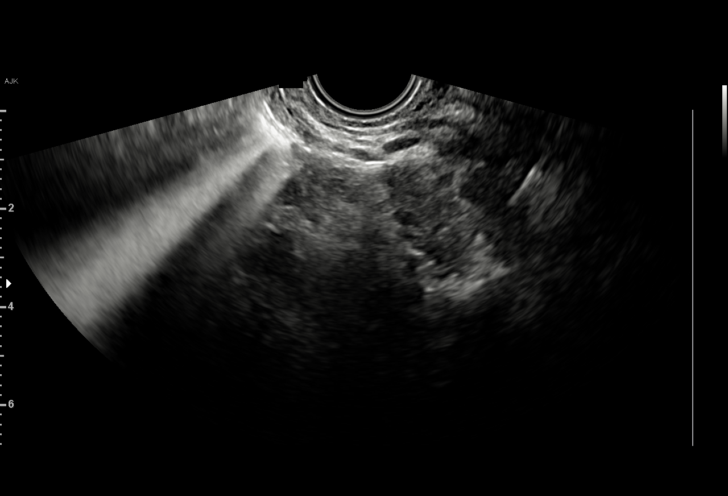
[im 35/45]
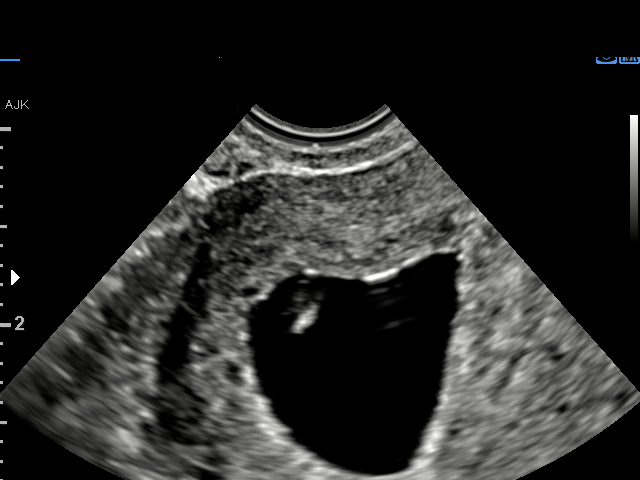
[im 38/45]
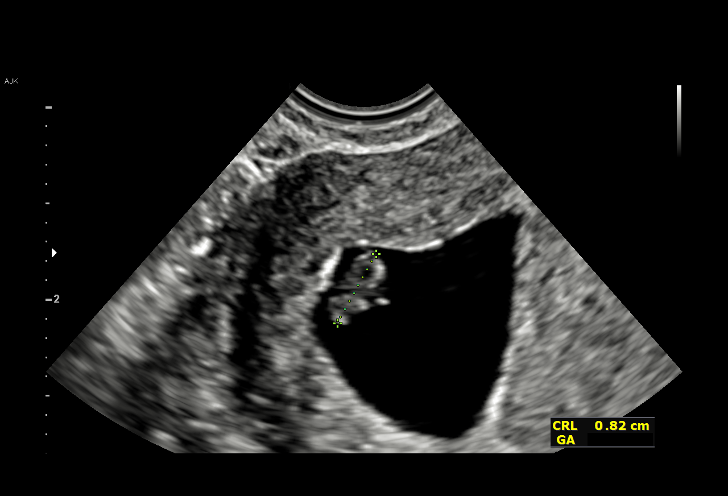
[im 41/45]
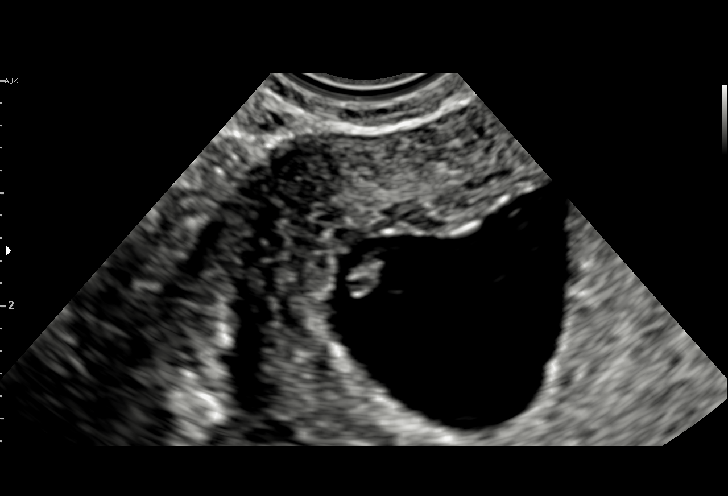
[im 45/45]
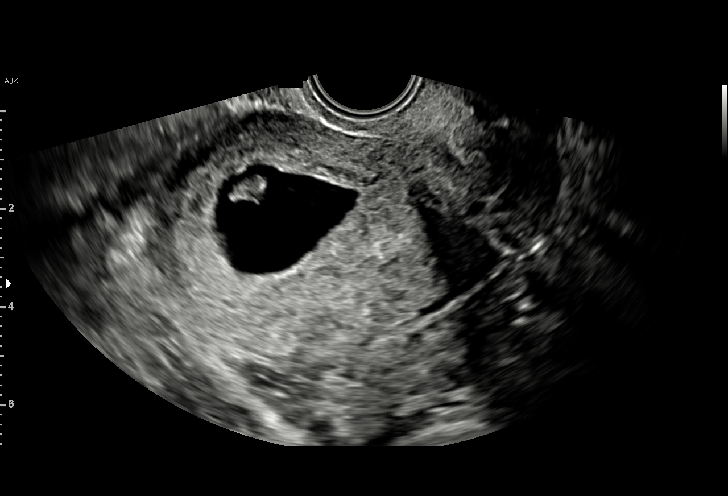

[15 of 28 positions shown; findings below may reference images not displayed]

FINDINGS: Intrauterine gestational sac: Single

Yolk sac:  Visualized.

Embryo:  Visualized.

Cardiac Activity: Not Visualized.

Heart Rate:   Not applicable

MSD: Appropriate given fetal size

CRL:   8 mm   6 w 5 d                  US EDC: Not applicable

Subchorionic hemorrhage:  None visualized.

Maternal uterus/adnexae: The uterus is anteverted. No intrauterine
masses are seen. The cervix is closed and is unremarkable. There is
no free fluid within the cul-de-sac. The maternal ovaries are
unremarkable.
IMPRESSION: Findings meet definitive criteria for failed pregnancy. This follows
SRU consensus guidelines: Diagnostic Criteria for Nonviable
Pregnancy Early in the First Trimester. N Engl J Med

## 2022-10-21 ENCOUNTER — Encounter (HOSPITAL_COMMUNITY): Payer: Self-pay

## 2022-10-21 ENCOUNTER — Ambulatory Visit (HOSPITAL_COMMUNITY)
Admission: EM | Admit: 2022-10-21 | Discharge: 2022-10-21 | Disposition: A | Discharge status: Home / Self Care | Payer: BC Managed Care – PPO | Attending: Emergency Medicine | Admitting: Emergency Medicine

## 2022-10-21 DIAGNOSIS — R059 Cough, unspecified: Secondary | ICD-10-CM | POA: Diagnosis not present

## 2022-10-21 DIAGNOSIS — J069 Acute upper respiratory infection, unspecified: Secondary | ICD-10-CM | POA: Diagnosis not present

## 2022-10-21 DIAGNOSIS — Z1152 Encounter for screening for COVID-19: Secondary | ICD-10-CM | POA: Diagnosis not present

## 2022-10-21 DIAGNOSIS — J45909 Unspecified asthma, uncomplicated: Secondary | ICD-10-CM | POA: Insufficient documentation

## 2022-10-21 DIAGNOSIS — J029 Acute pharyngitis, unspecified: Secondary | ICD-10-CM | POA: Insufficient documentation

## 2022-10-21 MED ORDER — KETOROLAC TROMETHAMINE 30 MG/ML IJ SOLN
30.0000 mg | Freq: Once | INTRAMUSCULAR | Status: AC
  Administered 2022-10-21: 30 mg via INTRAMUSCULAR

## 2022-10-21 MED ORDER — KETOROLAC TROMETHAMINE 30 MG/ML IJ SOLN
INTRAMUSCULAR | Status: AC
  Filled 2022-10-21: qty 1

## 2022-10-21 MED ORDER — PROMETHAZINE-DM 6.25-15 MG/5ML PO SYRP: 5.0000 mL | ORAL_SOLUTION | Freq: Four times a day (QID) | ORAL | 0 refills | Status: DC | PRN

## 2022-10-21 MED ORDER — BENZONATATE 100 MG PO CAPS: 100.0000 mg | ORAL_CAPSULE | Freq: Three times a day (TID) | ORAL | 0 refills | Status: DC

## 2022-10-21 NOTE — ED Provider Notes (Signed)
[facility]    CSN: [id number] Arrival date & time: 10/21/22  1551      History   Chief Complaint Chief Complaint  Patient presents with   Cough   Sore Throat    HPI Samantha Hardin is a 31 y.o. female.   Patient presents for evaluation of subjective fever, chills, nasal congestion, right-sided ear pain, rhinorrhea, sore throat and a cough for 2 days.  Pending intermittent generalized headache.  No known sick contacts.  Decreased appetite but tolerating some food and liquids.  Has attempted use of TheraFlu and BC powder which have been minimally effective.  History of asthma, migraines.  Denies shortness of breath or wheezing.   Past Medical History:  Diagnosis Date   Anemia    Asthma    Depression    never on meds, doing ok   Headache(784.0)    when on birth control   Heart murmur    Migraines    Trichomonas infection     Patient Active Problem List   Diagnosis Date Noted   Supervision of low-risk pregnancy, first trimester 10/10/2021   Iron deficiency anemia due to chronic blood loss 09/26/2019   Rh negative, antepartum 05/27/2016    Past Surgical History:  Procedure Laterality Date   CESAREAN SECTION N/A 09/14/2016   Procedure: CESAREAN SECTION;  Surgeon: Levie Heritage, DO;  Location: [facility] BIRTHING SUIT[facility];  Service: Obstetrics;  Laterality: N/A;   INDUCED ABORTION     PERINEAL LACERATION REPAIR N/A 09/14/2016   Procedure: Larene Pickett BALLOON PLACEMENT;  Surgeon: Levie Heritage, DO;  Location: [facility] BIRTHING SUITES;  Service: Obstetrics;  Laterality: N/A;    OB History     Gravida  4   Para  1   Term  1   Preterm  0   AB  2   Living  1      SAB  0   IAB  2   Ectopic  0   Multiple  0   Live Births  1            Home Medications    Prior to Admission medications   Medication Sig Start Date End Date Taking? Authorizing Provider  albuterol (VENTOLIN HFA) 108 (90 Base) MCG/ACT inhaler Inhale 2 puffs into the lungs every 6 (six)  hours as needed for wheezing or shortness of breath. 08/16/20   Renne Crigler, PA-C  HYDROcodone-acetaminophen (NORCO/VICODIN) 5-325 MG tablet Take 1 tablet by mouth every 6 (six) hours as needed for moderate pain. 10/29/21   Rasch, Victorino Dike I, NP  ibuprofen (ADVIL) 600 MG tablet Take 1 tablet (600 mg total) by mouth every 6 (six) hours as needed for mild pain. 10/29/21   Rasch, Victorino Dike I, NP  metoCLOPramide (REGLAN) 10 MG tablet Take 1 tablet (10 mg total) by mouth every 6 (six) hours. 10/17/21   Brand Males, CNM  misoprostol (CYTOTEC) 200 MCG tablet Place 4 tablets (800 mcg total) vaginally once for 1 dose. 11/01/21 11/01/21  Judeth Horn, NP  Prenatal Vit-Fe Fumarate-FA (PREPLUS) 27-1 MG TABS Take 1 tablet by mouth daily. 09/22/21   Brand Males, CNM  cetirizine (ZYRTEC) 10 MG tablet Take 1 tablet (10 mg total) by mouth daily. 09/19/18 12/07/19  Dahlia Byes A, NP  famotidine (PEPCID) 20 MG tablet Take 1 tablet (20 mg total) by mouth 2 (two) times daily. 03/25/19 12/07/19  Elvina Sidle, MD    Family History Family History  Problem Relation Age of Onset  Heart disease Maternal Grandfather    Diabetes Maternal Grandfather    Hypertension Maternal Grandfather    Healthy Mother    Healthy Father    Anesthesia problems Neg Hx     Social History Social History   Tobacco Use   Smoking status: Former    Packs/day: 0.25    Types: Cigarettes, Cigars    Quit date: 09/15/2021    Years since quitting: 1.0   Smokeless tobacco: Never  Vaping Use   Vaping Use: Never used  Substance Use Topics   Alcohol use: Not Currently    Comment: Infrequently.   Drug use: Not Currently    Types: Marijuana    Comment: 11/21, not since found out     Allergies   Latex   Review of Systems Review of Systems  Constitutional:  Positive for chills and fever. Negative for activity change, appetite change, diaphoresis, fatigue and unexpected weight change.  HENT:  Positive for congestion, ear  pain, rhinorrhea and sore throat. Negative for dental problem, drooling, ear discharge, facial swelling, hearing loss, mouth sores, nosebleeds, postnasal drip, sinus pressure, sinus pain, sneezing, tinnitus, trouble swallowing and voice change.   Respiratory:  Positive for cough. Negative for apnea, choking, chest tightness, shortness of breath, wheezing and stridor.   Cardiovascular: Negative.   Gastrointestinal: Negative.   Skin: Negative.   Neurological: Negative.      Physical Exam Triage Vital Signs ED Triage Vitals [10/21/22 1909]  Enc Vitals Group     BP 108/69     Pulse Rate 90     Resp 12     Temp 100 F (37.8 C)     Temp Source Oral     SpO2 98 %     Weight      Height      Head Circumference      Peak Flow      Pain Score 0     Pain Loc      Pain Edu?      Excl. in GC?    No data found.  Updated Vital Signs BP 108/69 (BP Location: Right Arm)   Pulse 90   Temp 100 F (37.8 C) (Oral)   Resp 12   LMP 10/14/2022   SpO2 98%   Visual Acuity Right Eye Distance:   Left Eye Distance:   Bilateral Distance:    Right Eye Near:   Left Eye Near:    Bilateral Near:     Physical Exam Constitutional:      Appearance: Normal appearance.  HENT:     Head: Normocephalic.     Right Ear: Tympanic membrane, ear canal and external ear normal.     Left Ear: Tympanic membrane, ear canal and external ear normal.     Nose: Congestion and rhinorrhea present.     Mouth/Throat:     Mouth: Mucous membranes are moist.     Pharynx: Posterior oropharyngeal erythema present.     Tonsils: No tonsillar exudate. 0 on the right. 0 on the left.  Cardiovascular:     Rate and Rhythm: Normal rate and regular rhythm.     Pulses: Normal pulses.     Heart sounds: Normal heart sounds.  Pulmonary:     Effort: Pulmonary effort is normal.     Breath sounds: Normal breath sounds.  Skin:    General: Skin is warm and dry.  Neurological:     Mental Status: She is alert and oriented to  person, place, and time.  Mental status is at baseline.  Psychiatric:        Mood and Affect: Mood normal.        Behavior: Behavior normal.      UC Treatments / Results  Labs (all labs ordered are listed, but only abnormal results are displayed) Labs Reviewed - No data to display  EKG   Radiology No results found.  Procedures Procedures (including critical care time)  Medications Ordered in UC Medications - No data to display  Initial Impression / Assessment and Plan / UC Course  I have reviewed the triage vital signs and the nursing notes.  Pertinent labs & imaging results that were available during my care of the patient were reviewed by me and considered in my medical decision making (see chart for details).  Viral URI with cough  Patient is in no signs of distress nor toxic appearing.  Vital signs are stable.  Low suspicion for pneumonia, pneumothorax or bronchitis and therefore will defer imaging.  COVID test is pending, reviewed quarantine guidelines per CDC recommendations due to history of asthma may receive antivirals if she would like  Prescribed Promethazine DM and Tessalon for outpatient use, given injection of Toradol for management of headaches   May use additional over-the-counter medications as needed for supportive care.  May follow-up with urgent care as needed if symptoms persist or worsen.  Note given.   Final Clinical Impressions(s) / UC Diagnoses   Final diagnoses:  None   Discharge Instructions   None    ED Prescriptions   None    PDMP not reviewed this encounter.   Hans Eden, NP 10/21/22 1954

## 2022-10-21 NOTE — Discharge Instructions (Signed)
Your symptoms today are most likely being caused by a virus and should steadily improve in time it can take up to 7 to 10 days before you truly start to see a turnaround however things will get better  COVID test pending up to 24 hours, you will be notified of positive test results only, if positive you will need to quarantine for an additional 3 days and may return activity on October 25, 2022  You have been given an injection of Toradol here today in the office which helps to reduce inflammation and irritation, ideally will start to see relief from your headache in about 30 minutes    You can take Tylenol and/or Ibuprofen as needed for fever reduction and pain relief.   For cough: honey 1/2 to 1 teaspoon (you can dilute the honey in water or another fluid).  You can also use guaifenesin and dextromethorphan for cough. You can use a humidifier for chest congestion and cough.  If you don't have a humidifier, you can sit in the bathroom with the hot shower running.      For sore throat: try warm salt water gargles, cepacol lozenges, throat spray, warm tea or water with lemon/honey, popsicles or ice, or OTC cold relief medicine for throat discomfort.   For congestion: take a daily anti-histamine like Zyrtec, Claritin, and a oral decongestant, such as pseudoephedrine.  You can also use Flonase 1-2 sprays in each nostril daily.   It is important to stay hydrated: drink plenty of fluids (water, gatorade/powerade/pedialyte, juices, or teas) to keep your throat moisturized and help further relieve irritation/discomfort.

## 2022-10-21 NOTE — ED Triage Notes (Signed)
Pt is here for cough, sore throat , body aches, chills, fever, headaches nasal congestion, bilateral ear pain , runny nose , fatigue, loss of appetite x 2days

## 2022-10-22 LAB — SARS CORONAVIRUS 2 (TAT 6-24 HRS): SARS Coronavirus 2: NEGATIVE

## 2023-04-01 ENCOUNTER — Ambulatory Visit: Payer: BC Managed Care – PPO | Admitting: Family Medicine

## 2023-04-14 ENCOUNTER — Telehealth: Payer: Self-pay

## 2023-04-14 NOTE — Telephone Encounter (Signed)
Patient calls nurse line reporting a possible miscarriage.   She reports her LMP was "the beginning of May." She reports she has regular periods around the first of each month.   She reports she had a positive home pregnancy test last week. She reports she started bleeding "heavy" yesterday and reports abdominal cramping. She reports she feels she "passed something" while using the bathroom last night. She reports the bleeding has decreased today and reports 3/10 cramping.   Patient advised to go to MAU for evaluation.   Patient was very adamant she would rather see her PCP.   Patient scheduled for tomorrow morning.   Strict MAU precautions given to patient.

## 2023-04-15 ENCOUNTER — Ambulatory Visit: Payer: BC Managed Care – PPO | Admitting: Family Medicine

## 2023-04-19 ENCOUNTER — Inpatient Hospital Stay (HOSPITAL_COMMUNITY)
Admission: AD | Admit: 2023-04-19 | Discharge: 2023-04-19 | Discharge status: Against Medical Advice | Payer: BC Managed Care – PPO | Attending: Family Medicine | Admitting: Family Medicine

## 2023-04-19 ENCOUNTER — Other Ambulatory Visit: Payer: Self-pay

## 2023-04-19 ENCOUNTER — Inpatient Hospital Stay (HOSPITAL_COMMUNITY)
Admission: AD | Admit: 2023-04-19 | Discharge: 2023-04-19 | Disposition: A | Discharge status: Home / Self Care | Payer: BC Managed Care – PPO | Source: Home / Self Care | Attending: Family Medicine | Admitting: Family Medicine

## 2023-04-19 DIAGNOSIS — O039 Complete or unspecified spontaneous abortion without complication: Secondary | ICD-10-CM | POA: Insufficient documentation

## 2023-04-19 LAB — HCG, QUANTITATIVE, PREGNANCY: hCG, Beta Chain, Quant, S: 1 m[IU]/mL (ref <5)

## 2023-04-19 LAB — POCT PREGNANCY, URINE: Preg Test, Ur: NEGATIVE

## 2023-04-19 NOTE — MAU Provider Note (Signed)
None     S [name] is a 32 y.o. [phone] patient who presents to MAU today with complaint of possible miscarriage.  Patient had 8 positive pregnancy test at home last week, then had bleeding following that.  This is her second miscarriage.  She does have 1 live child who is 6.  Same father of the baby.  She wanted to make sure that she is able to get pregnant  O BP 119/76 (BP Location: Right Arm)   Pulse 68   Temp 98.2 F (36.8 C) (Oral)   Resp 12   Ht 5\' 6"  (1.676 m)   Wt 68 kg   BMI 24.19 kg/m  Physical Exam Vitals and nursing note reviewed.  Constitutional:      Appearance: Normal appearance.  Abdominal:     General: Abdomen is flat.     Palpations: Abdomen is soft.  Skin:    Capillary Refill: Capillary refill takes less than 2 seconds.  Neurological:     General: No focal deficit present.     Mental Status: She is alert.  Psychiatric:        Mood and Affect: Mood normal.        Behavior: Behavior normal.        Thought Content: Thought content normal.        Judgment: Judgment normal.     A Medical screening exam complete 1. SAB (spontaneous abortion)      P Discharge from MAU in stable condition Will get her in to the office. Warning signs for worsening condition that would warrant emergency follow-up discussed Patient may return to MAU as needed   Levie Heritage, DO 04/19/2023 8:01 PM

## 2023-04-19 NOTE — MAU Note (Signed)
Pt says he was here at 2pm- She has seen her results on MyChart  She wants somebody to talk to her about results .

## 2023-04-19 NOTE — MAU Note (Signed)
Samantha Hardin is a 32 y.o. at Unknown here in MAU reporting: she's had a +HPT last week and had both VB and abdominal pain, no longer having either.  States unsure if she possibly miscarried despite having +HPT after VB episode.  Request further evaluation. LMP: last week in May  Onset of complaint: last week Pain score: 0 Vitals:   04/19/23 1424  BP: 126/71  Pulse: 77  Resp: 18  Temp: 98.1 F (36.7 C)  SpO2: 100%     FHT:NA Lab orders placed from triage:   UPT

## 2023-04-20 ENCOUNTER — Telehealth: Payer: Self-pay | Admitting: Family Medicine

## 2023-04-20 NOTE — Telephone Encounter (Signed)
-----   Message from Levie Heritage, DO sent at 04/19/2023  8:03 PM EDT ----- Regarding: appt Had SAB - needs follow up.

## 2023-04-20 NOTE — Telephone Encounter (Signed)
Spoke to patient about her scheduled appointment.

## 2023-05-18 ENCOUNTER — Ambulatory Visit: Payer: BC Managed Care – PPO | Admitting: Family Medicine

## 2023-05-30 NOTE — Progress Notes (Deleted)
    SUBJECTIVE:   CHIEF COMPLAINT / HPI:   ***  Not due for pap  PERTINENT  PMH / PSH: ***  OBJECTIVE:   There were no vitals taken for this visit.  ***  ASSESSMENT/PLAN:   No problem-specific Assessment & Plan notes found for this encounter.     Lincoln Brigham, MD [facility]

## 2023-05-31 ENCOUNTER — Ambulatory Visit: Payer: BC Managed Care – PPO | Admitting: Student

## 2023-05-31 ENCOUNTER — Ambulatory Visit: Payer: BC Managed Care – PPO | Admitting: Family Medicine

## 2023-05-31 NOTE — Progress Notes (Deleted)
  SUBJECTIVE:   CHIEF COMPLAINT / HPI:   Pap Smear Had performed in 2017? But difficult to find results Recently had SAB; was supposed to follow with OBGYN but missed that appt   PERTINENT  PMH / PSH:  IDA   Patient Care Team: Lincoln Brigham, MD as PCP - General (Family Medicine) OBJECTIVE:  There were no vitals taken for this visit. Physical Exam   ASSESSMENT/PLAN:  There are no diagnoses linked to this encounter. No follow-ups on file. Alfredo Martinez, MD 05/31/2023, 9:53 AM PGY-***, [facility] {    This will disappear when note is signed, click to select method of visit    :1}

## 2023-10-18 ENCOUNTER — Ambulatory Visit (INDEPENDENT_AMBULATORY_CARE_PROVIDER_SITE_OTHER): Payer: BC Managed Care – PPO | Admitting: Family Medicine

## 2023-10-18 VITALS — BP 120/86 | HR 67 | Ht 66.0 in | Wt 148.5 lb

## 2023-10-18 DIAGNOSIS — M25561 Pain in right knee: Secondary | ICD-10-CM | POA: Diagnosis not present

## 2023-10-18 MED ORDER — MELOXICAM 15 MG PO TABS
15.0000 mg | ORAL_TABLET | Freq: Every day | ORAL | 0 refills | Status: DC
Start: 2023-10-18 — End: 2024-09-04

## 2023-10-18 NOTE — Patient Instructions (Signed)
Please take meloxicam daily and follow up with sports medicine - you should get a call about scheduling an appointment within the next 2-3 weeks

## 2023-10-18 NOTE — Progress Notes (Signed)
    SUBJECTIVE:   CHIEF COMPLAINT / HPI:   Right knee pain ongoing x 2 weeks No injury that she recalls.  However, she is on her feet all day working at [employer] some swelling but no redness Her pain is diffuse throughout her anterior knee.  Occasionally worse laterally She was given a medication by one of her coworkers but does not recall what this was.  Otherwise has not been taking anything   PERTINENT  PMH / PSH: N/A  OBJECTIVE:   BP 120/86   Pulse 67   Ht 5\' 6"  (1.676 m)   Wt 148 lb 8 oz (67.4 kg)   LMP 10/16/2023   SpO2 100%   BMI 23.97 kg/m   General: NAD, pleasant, able to participate in exam Respiratory: No respiratory distress Skin: warm and dry, no rashes noted Psych: Normal affect and mood  Right knee-patient wearing tight jeans Inspection: No gross deformity, appears symmetric compared to left Palpation: Does appear to have some swelling compared to left.  She is mildly tender throughout her entire knee including tendons and joint line, no focal points of elevated tenderness identified ROM: Full passive range of motion though she does have some stiffness and hesitancy with active range of motion, especially with extension Strength: 5/5 flexion extension Special tests: Pain laterally with Apley's and Thessaly's.  Negative McMurray.  Negative anterior/posterior drawers.  ASSESSMENT/PLAN:   Assessment & Plan Right knee pain, unspecified chronicity No specific recent traumatic event although her exam is concerning for possible injury to her knee, particularly her lateral meniscus.  Given her age low concern for OA.  Reassuringly she does not have any significant laxity or severe tenderness/pain to suggest need for emergent imaging.  Do feel that formal evaluation and possible ultrasound by sports medicine would be beneficial to further characterize her injury.  In the meantime, discussed RICE therapy and prescribed a short course of meloxicam to assist with  any inflammation.   Vonna Drafts, MD [facility]

## 2023-11-29 ENCOUNTER — Ambulatory Visit: Payer: BC Managed Care – PPO | Admitting: Family Medicine

## 2023-12-01 ENCOUNTER — Ambulatory Visit: Payer: BC Managed Care – PPO | Admitting: Family Medicine

## 2023-12-01 ENCOUNTER — Other Ambulatory Visit: Payer: Self-pay

## 2023-12-01 VITALS — BP 104/78 | Ht 66.0 in | Wt 148.0 lb

## 2023-12-01 DIAGNOSIS — M25562 Pain in left knee: Secondary | ICD-10-CM

## 2023-12-01 DIAGNOSIS — M25561 Pain in right knee: Secondary | ICD-10-CM | POA: Diagnosis not present

## 2023-12-01 DIAGNOSIS — G8929 Other chronic pain: Secondary | ICD-10-CM

## 2023-12-01 NOTE — Patient Instructions (Addendum)
 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 Avoid painful activities when possible (often deep squats, lunges, leg press bother this). Do home exercises daily. Add ankle weight if these become too easy. Consider formal physical therapy. Consider insoles for arch support. Avoid flat shoes, barefoot walking as much as possible. Icing 15 minutes at a time 3-4 times a day as needed. Voltaren gel up to 4 times a day as needed for pain and inflammation. Follow up with me in 6 weeks.

## 2023-12-02 ENCOUNTER — Encounter: Payer: Self-pay | Admitting: Family Medicine

## 2023-12-02 NOTE — Progress Notes (Signed)
 PCP: Elicia Hamlet, MD  Subjective:   HPI: Patient is a 33 y.o. female here for bilateral knee pain.  Patient reports bilateral knee pain since December. 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 Pain worse anterior to posterior knees after this. Some swelling. No catching, lockng, giving out. Tried ibuprofen  and meloxicam  without much help. No acute injury or trauma.  Past Medical History:  Diagnosis Date   Anemia    Asthma    Depression    never on meds, doing ok   Headache(784.0)    when on birth control   Heart murmur    Migraines    Trichomonas infection     Current Outpatient Medications on File Prior to Visit  Medication Sig Dispense Refill   meloxicam  (MOBIC ) 15 MG tablet Take 1 tablet (15 mg total) by mouth daily. 15 tablet 0   [DISCONTINUED] cetirizine  (ZYRTEC ) 10 MG tablet Take 1 tablet (10 mg total) by mouth daily. 30 tablet 0   [DISCONTINUED] famotidine  (PEPCID ) 20 MG tablet Take 1 tablet (20 mg total) by mouth 2 (two) times daily. 30 tablet 0   No current facility-administered medications on file prior to visit.    Past Surgical History:  Procedure Laterality Date   CESAREAN SECTION N/A 09/14/2016   Procedure: CESAREAN SECTION;  Surgeon: Lang JINNY Peel, DO;  Location: [facility] BIRTHING SUITES;  Service: Obstetrics;  Laterality: N/A;   INDUCED ABORTION     PERINEAL LACERATION REPAIR N/A 09/14/2016   Procedure: CONARD BALLOON PLACEMENT;  Surgeon: Lang JINNY Peel, DO;  Location: [facility] BIRTHING SUITES;  Service: Obstetrics;  Laterality: N/A;    Allergies  Allergen Reactions   Latex Swelling and Rash    BP 104/78   Ht 5' 6 (1.676 m)   Wt 148 lb (67.1 kg)   BMI 23.89 kg/m       No data to display              No data to display              Objective:  Physical Exam:  Gen: NAD, comfortable in exam room  Bilateral knees: No gross deformity, ecchymoses, swelling. TTP mildly medial joint lines. FROM with normal  strength. Negative ant/post drawers. Negative valgus/varus testing. Negative lachman.  Negative mcmurrays, apleys, thessalys. NV intact distally.   Limited MSK u/s bilateral knees - no effusion.  No bakers cysts.  Quad and patellar tendons intact.  Trace infrapatellar bursitis on right.  No visible medial meniscus tear bilaterally.  Assessment & Plan:  1. Bilateral knee pain - exam and ultrasound reassuring.  History suggestive of patellofemoral syndrome.  Today with some medial joint line tenderness but otherwise no physical findings.  Start home exercise program.  Icing, voltaren gel.  Consider physical therapy.  Follow up in 6 weeks.

## 2023-12-29 ENCOUNTER — Encounter: Payer: Self-pay | Admitting: Emergency Medicine

## 2023-12-29 ENCOUNTER — Ambulatory Visit
Admission: EM | Admit: 2023-12-29 | Discharge: 2023-12-29 | Disposition: A | Discharge status: Home / Self Care | Payer: Self-pay | Attending: Internal Medicine | Admitting: Internal Medicine

## 2023-12-29 DIAGNOSIS — J069 Acute upper respiratory infection, unspecified: Secondary | ICD-10-CM

## 2023-12-29 LAB — POC COVID19/FLU A&B COMBO
Covid Antigen, POC: NEGATIVE
Influenza A Antigen, POC: NEGATIVE
Influenza B Antigen, POC: NEGATIVE

## 2023-12-29 MED ORDER — PROMETHAZINE-DM 6.25-15 MG/5ML PO SYRP: 5.0000 mL | ORAL_SOLUTION | Freq: Every evening | ORAL | 0 refills | Status: DC | PRN

## 2023-12-29 NOTE — ED Triage Notes (Addendum)
 Pt present with cough, congestion, headache for 2 days. She found out today that 2 people at here job tested positive for covid.   She took Nyquil and ibuprofen around 7

## 2023-12-29 NOTE — ED Provider Notes (Signed)
 [facility]    CSN: [id number] Arrival date & time: 12/29/23  1701      History   Chief Complaint Chief Complaint  Patient presents with   Covid Exposure   Cough    HPI Samantha Hardin is a 33 y.o. female.   Samantha Hardin is a 33 y.o. female presenting for chief complaint of cough, congestion, generalized fatigue, sore throat, and body aches that started 2 days ago on Monday, December 27, 2023.  Reports exposure to multiple recent coworkers who tested positive for COVID-19.  Cough is mostly dry and nonproductive.  She denies nausea, vomiting, diarrhea, abdominal pain, rash, shortness of breath, chest pain, heart palpitations, and documented fever at home.  Reports significant cold chills and sweats.  Former Radiation protection practitioner smoker.  History of asthma that is well-controlled with as needed use of albuterol inhaler.  Taking over-the-counter medications for symptoms with some relief.   Cough   Past Medical History:  Diagnosis Date   Anemia    Asthma    Depression    never on meds, doing ok   Headache(784.0)    when on birth control   Heart murmur    Migraines    Trichomonas infection     Patient Active Problem List   Diagnosis Date Noted   Supervision of low-risk pregnancy, first trimester 10/10/2021   Iron deficiency anemia due to chronic blood loss 09/26/2019   Rh negative, antepartum 05/27/2016    Past Surgical History:  Procedure Laterality Date   CESAREAN SECTION N/A 09/14/2016   Procedure: CESAREAN SECTION;  Surgeon: Levie Heritage, DO;  Location: [facility] BIRTHING SUITES;  Service: Obstetrics;  Laterality: N/A;   INDUCED ABORTION     PERINEAL LACERATION REPAIR N/A 09/14/2016   Procedure: Larene Pickett BALLOON PLACEMENT;  Surgeon: Levie Heritage, DO;  Location: [facility] BIRTHING SUITES;  Service: Obstetrics;  Laterality: N/A;    OB History     Gravida  4   Para  1   Term  1   Preterm  0   AB  2   Living  1      SAB  0   IAB  2   Ectopic  0    Multiple  0   Live Births  1            Home Medications    Prior to Admission medications   Medication Sig Start Date End Date Taking? Authorizing Provider  promethazine-dextromethorphan (PROMETHAZINE-DM) 6.25-15 MG/5ML syrup Take 5 mLs by mouth at bedtime as needed for cough. 12/29/23  Yes Carlisle Beers, FNP  meloxicam (MOBIC) 15 MG tablet Take 1 tablet (15 mg total) by mouth daily. 10/18/23   Vonna Drafts, MD  cetirizine (ZYRTEC) 10 MG tablet Take 1 tablet (10 mg total) by mouth daily. 09/19/18 12/07/19  Janace Aris, FNP  famotidine (PEPCID) 20 MG tablet Take 1 tablet (20 mg total) by mouth 2 (two) times daily. 03/25/19 12/07/19  Elvina Sidle, MD    Family History Family History  Problem Relation Age of Onset   Heart disease Maternal Grandfather    Diabetes Maternal Grandfather    Hypertension Maternal Grandfather    Healthy Mother    Healthy Father    Anesthesia problems Neg Hx     Social History Social History   Tobacco Use   Smoking status: Former    Current packs/day: 0.00    Types: Cigarettes, Cigars    Quit date: 09/15/2021    Years  since quitting: 2.2   Smokeless tobacco: Never  Vaping Use   Vaping status: Never Used  Substance Use Topics   Alcohol use: Not Currently    Comment: Infrequently.   Drug use: Not Currently    Types: Marijuana    Comment: 11/21, not since found out     Allergies   Latex   Review of Systems Review of Systems  Respiratory:  Positive for cough.   Per HPI   Physical Exam Triage Vital Signs ED Triage Vitals  Encounter Vitals Group     BP 12/29/23 1738 117/69     Systolic BP Percentile --      Diastolic BP Percentile --      Pulse Rate 12/29/23 1738 85     Resp 12/29/23 1738 17     Temp 12/29/23 1738 98.3 F (36.8 C)     Temp Source 12/29/23 1738 Oral     SpO2 12/29/23 1738 98 %     Weight --      Height --      Head Circumference --      Peak Flow --      Pain Score 12/29/23 1744 7     Pain Loc  --      Pain Education --      Exclude from Growth Chart --    No data found.  Updated Vital Signs BP 117/69 (BP Location: Right Arm)   Pulse 85   Temp 98.3 F (36.8 C) (Oral)   Resp 17   LMP 12/24/2023 (Approximate)   SpO2 98%   Visual Acuity Right Eye Distance:   Left Eye Distance:   Bilateral Distance:    Right Eye Near:   Left Eye Near:    Bilateral Near:     Physical Exam Vitals and nursing note reviewed.  Constitutional:      Appearance: She is ill-appearing. She is not toxic-appearing.  HENT:     Head: Normocephalic and atraumatic.     Right Ear: Hearing, tympanic membrane, ear canal and external ear normal.     Left Ear: Hearing, tympanic membrane, ear canal and external ear normal.     Nose: Nose normal.     Mouth/Throat:     Lips: Pink.     Mouth: Mucous membranes are moist. No injury or oral lesions.     Dentition: Normal dentition.     Tongue: No lesions.     Pharynx: Oropharynx is clear. Uvula midline. No pharyngeal swelling, oropharyngeal exudate, posterior oropharyngeal erythema, uvula swelling or postnasal drip.     Tonsils: No tonsillar exudate.  Eyes:     General: Lids are normal. Vision grossly intact. Gaze aligned appropriately.     Extraocular Movements: Extraocular movements intact.     Conjunctiva/sclera: Conjunctivae normal.  Neck:     Trachea: Trachea and phonation normal.  Cardiovascular:     Rate and Rhythm: Normal rate and regular rhythm.     Heart sounds: Normal heart sounds, S1 normal and S2 normal.  Pulmonary:     Effort: Pulmonary effort is normal. No respiratory distress.     Breath sounds: Normal breath sounds and air entry.  Musculoskeletal:     Cervical back: Neck supple.  Lymphadenopathy:     Cervical: No cervical adenopathy.  Skin:    General: Skin is warm and dry.     Capillary Refill: Capillary refill takes less than 2 seconds.     Findings: No rash.  Neurological:     General:  No focal deficit present.     Mental  Status: She is alert and oriented to person, place, and time. Mental status is at baseline.     Cranial Nerves: No dysarthria or facial asymmetry.  Psychiatric:        Mood and Affect: Mood normal.        Speech: Speech normal.        Behavior: Behavior normal.        Thought Content: Thought content normal.        Judgment: Judgment normal.      UC Treatments / Results  Labs (all labs ordered are listed, but only abnormal results are displayed) Labs Reviewed  SARS CORONAVIRUS 2 (TAT 6-24 HRS)  POC COVID19/FLU A&B COMBO    EKG   Radiology No results found.  Procedures Procedures (including critical care time)  Medications Ordered in UC Medications - No data to display  Initial Impression / Assessment and Plan / UC Course  I have reviewed the triage vital signs and the nursing notes.  Pertinent labs & imaging results that were available during my care of the patient were reviewed by me and considered in my medical decision making (see chart for details).   1.  Viral URI with cough Suspect viral URI, viral syndrome.  Strep/viral testing: Point-of-care COVID and flu testing negative, PCR COVID testing pending, staff will call if patient is positive for COVID and treat with antiviral at that time.  Physical exam findings reassuring, vital signs hemodynamically stable, and lungs clear, therefore deferred imaging of the chest.  Advised supportive care/prescriptions for symptomatic relief as outlined in AVS.    Counseled patient on potential for adverse effects with medications prescribed/recommended today, strict ER and return-to-clinic precautions discussed, patient verbalized understanding.    Final Clinical Impressions(s) / UC Diagnoses   Final diagnoses:  Viral URI with cough     Discharge Instructions      You have a viral illness which will improve on its own with rest, fluids, and medications to help with your symptoms.  Tylenol, guaifenesin (plain  mucinex), and saline nasal sprays may help relieve symptoms.  Promethazine DM at bedtime as needed for cough.   Two teaspoons of honey in 1 cup of warm water every 4-6 hours may help with throat pains.  Humidifier in room at nighttime may help soothe cough (clean well daily).   For chest pain, shortness of breath, inability to keep food or fluids down without vomiting, fever that does not respond to tylenol or motrin, or any other severe symptoms, please go to the ER for further evaluation. Return to urgent care as needed, otherwise follow-up with PCP.     ED Prescriptions     Medication Sig Dispense Auth. Provider   promethazine-dextromethorphan (PROMETHAZINE-DM) 6.25-15 MG/5ML syrup Take 5 mLs by mouth at bedtime as needed for cough. 118 mL Carlisle Beers, FNP      PDMP not reviewed this encounter.   Carlisle Beers, [place] 12/29/23 [place]andy

## 2023-12-29 NOTE — Discharge Instructions (Signed)
 You have a viral illness which will improve on its own with rest, fluids, and medications to help with your symptoms.  Tylenol, guaifenesin (plain mucinex), and saline nasal sprays may help relieve symptoms.  Promethazine DM at bedtime as needed for cough.  Two teaspoons of honey in 1 cup of warm water every 4-6 hours may help with throat pains.  Humidifier in room at nighttime may help soothe cough (clean well daily).   For chest pain, shortness of breath, inability to keep food or fluids down without vomiting, fever that does not respond to tylenol or motrin, or any other severe symptoms, please go to the ER for further evaluation. Return to urgent care as needed, otherwise follow-up with PCP.

## 2023-12-30 LAB — SARS CORONAVIRUS 2 (TAT 6-24 HRS): SARS Coronavirus 2: NEGATIVE

## 2024-04-13 ENCOUNTER — Telehealth: Payer: Self-pay

## 2024-04-13 NOTE — Telephone Encounter (Signed)
 Patient calls nurse line requesting to schedule appointment for intermittent chest pain that has been occurring for the last two weeks. She describes the pain as sharp. Denies accompanied shortness of breath, radiation into the arm, jaw pain or lightheadedness/dizziness.   She reports that she will take ibuprofen  that provides some relief. States that episodes typically resolve within one hour.   Scheduled for evaluation tomorrow afternoon with Dr. Bernardino Bridge.   ED precautions discussed.   Elsie Halo, RN

## 2024-04-14 ENCOUNTER — Ambulatory Visit: Payer: Self-pay | Admitting: Family Medicine

## 2024-04-14 NOTE — Progress Notes (Deleted)
    SUBJECTIVE:   CHIEF COMPLAINT / HPI:   Chest pain Chest pain began ***  Pain is ***, Rate ***/10 and radiates to *** It is worse with *** and improves with *** *** Patient believes might be causing their pain: *** Improvement with Nitroglycerine: ***   {Associated Symptoms:25148}  {Patient Denies:25149}      [email or web address]  [place] Calculator    PERTINENT  PMH / PSH: Reviewed. Iron Deficiency Anemia  OBJECTIVE:   There were no vitals taken for this visit.  *** General: ***-appearing, no acute distress. HEENT: normocephalic, PERRLA, MMM, bilateral TM visualized without erythema or bulging. Cardio: Regular rate, *** rhythm, no murmurs on exam. Pulm: Clear, no wheezing, no crackles. No increased work of breathing. Abdominal: bowel sounds present, soft, non-tender, non-distended. Extremities: no peripheral edema. Moves all extremities equally. Neuro: Alert and oriented x3, speech normal in content, no facial asymmetry, strength intact and equal bilaterally in UE and LE, pupils equal and reactive to light.  Psych:  Cognition and judgment appear intact. Alert, communicative, and cooperative with normal attention span and concentration. No apparent delusions, illusions, hallucinations    ASSESSMENT/PLAN:   Assessment & Plan Chest pain, unspecified type The most likely diagnosis is***.  I have considering incomplete the patient has a very low likelihood of having a myocardial infarction/unstable angina, pulmonary embolus, ***     Omar Bibber, DO [facility]

## 2024-08-21 ENCOUNTER — Ambulatory Visit: Payer: Self-pay | Admitting: Family Medicine

## 2024-09-04 ENCOUNTER — Other Ambulatory Visit: Payer: Self-pay

## 2024-09-04 ENCOUNTER — Ambulatory Visit: Payer: Self-pay | Admitting: Radiology

## 2024-09-04 ENCOUNTER — Ambulatory Visit
Admission: EM | Admit: 2024-09-04 | Discharge: 2024-09-04 | Disposition: A | Discharge status: Home / Self Care | Payer: Self-pay | Attending: Emergency Medicine | Admitting: Emergency Medicine

## 2024-09-04 DIAGNOSIS — R051 Acute cough: Secondary | ICD-10-CM

## 2024-09-04 DIAGNOSIS — J4521 Mild intermittent asthma with (acute) exacerbation: Secondary | ICD-10-CM

## 2024-09-04 LAB — POC COVID19/FLU A&B COMBO
Covid Antigen, POC: NEGATIVE
Influenza A Antigen, POC: NEGATIVE
Influenza B Antigen, POC: NEGATIVE

## 2024-09-04 MED ORDER — PROMETHAZINE-DM 6.25-15 MG/5ML PO SYRP: 5.0000 mL | ORAL_SOLUTION | Freq: Four times a day (QID) | ORAL | 0 refills | Status: AC | PRN

## 2024-09-04 MED ORDER — ACETAMINOPHEN 325 MG PO TABS
975.0000 mg | ORAL_TABLET | Freq: Once | ORAL | Status: AC
  Administered 2024-09-04: 975 mg via ORAL

## 2024-09-04 MED ORDER — ALBUTEROL SULFATE HFA 108 (90 BASE) MCG/ACT IN AERS: 2.0000 | INHALATION_SPRAY | Freq: Four times a day (QID) | RESPIRATORY_TRACT | 1 refills | Status: AC | PRN

## 2024-09-04 MED ORDER — IPRATROPIUM-ALBUTEROL 0.5-2.5 (3) MG/3ML IN SOLN
3.0000 mL | Freq: Once | RESPIRATORY_TRACT | Status: AC
  Administered 2024-09-04: 3 mL via RESPIRATORY_TRACT

## 2024-09-04 MED ORDER — PREDNISONE 20 MG PO TABS: 40.0000 mg | ORAL_TABLET | Freq: Every day | ORAL | 0 refills | Status: AC

## 2024-09-04 NOTE — ED Provider Notes (Signed)
 [facility]    CSN: [id number] Arrival date & time: 09/04/24  1527      History   Chief Complaint Chief Complaint  Patient presents with   Cough   Generalized Body Aches    HPI Samantha Hardin is a 33 y.o. female.  3 day history of body aches, runny nose, and cough Tactile fever, not measured at home.  Reporting tightness in the chest, short of breath and restricted breathing. Asthma history - has tried inhaler without much change.   Using nyquil and mucinex   Possible sick contacts at work  LMP now   Reports mold exposure recently.   Past Medical History:  Diagnosis Date   Anemia    Asthma    Depression    never on meds, doing ok   Headache(784.0)    when on birth control   Heart murmur    Migraines    Trichomonas infection     Patient Active Problem List   Diagnosis Date Noted   Supervision of low-risk pregnancy, first trimester 10/10/2021   Iron deficiency anemia due to chronic blood loss 09/26/2019   Rh negative, antepartum 05/27/2016    Past Surgical History:  Procedure Laterality Date   CESAREAN SECTION N/A 09/14/2016   Procedure: CESAREAN SECTION;  Surgeon: Lang JINNY Peel, DO;  Location: [facility] BIRTHING SUITES;  Service: Obstetrics;  Laterality: N/A;   INDUCED ABORTION     PERINEAL LACERATION REPAIR N/A 09/14/2016   Procedure: CONARD BALLOON PLACEMENT;  Surgeon: Lang JINNY Peel, DO;  Location: [facility] BIRTHING SUITES;  Service: Obstetrics;  Laterality: N/A;    OB History     Gravida  4   Para  1   Term  1   Preterm  0   AB  2   Living  1      SAB  0   IAB  2   Ectopic  0   Multiple  0   Live Births  1            Home Medications    Prior to Admission medications   Medication Sig Start Date End Date Taking? Authorizing Provider  albuterol  (VENTOLIN  HFA) 108 (90 Base) MCG/ACT inhaler Inhale 2 puffs into the lungs every 6 (six) hours as needed for wheezing or shortness of breath. 09/04/24  Yes Charlynn Salih, [place],  PA-C  predniSONE (DELTASONE) 20 MG tablet Take 2 tablets (40 mg total) by mouth daily with breakfast for 5 days. 09/04/24 09/09/24 Yes Kadie Balestrieri, [place]berry, PA-C  promethazine -dextromethorphan (PROMETHAZINE -DM) 6.25-15 MG/5ML syrup Take 5 mLs by mouth 4 (four) times daily as needed for cough. 09/04/24  Yes Eyleen Rawlinson, [place], PA-C  cetirizine  (ZYRTEC ) 10 MG tablet Take 1 tablet (10 mg total) by mouth daily. 09/19/18 12/07/19  Adah Wilbert LABOR, FNP  famotidine  (PEPCID ) 20 MG tablet Take 1 tablet (20 mg total) by mouth 2 (two) times daily. 03/25/19 12/07/19  Mario Million, MD    Family History Family History  Problem Relation Age of Onset   Heart disease Maternal Grandfather    Diabetes Maternal Grandfather    Hypertension Maternal Grandfather    Healthy Mother    Healthy Father    Anesthesia problems Neg Hx     Social History Social History   Tobacco Use   Smoking status: Former    Current packs/day: 0.00    Types: Cigarettes, Cigars    Quit date: 09/15/2021    Years since quitting: 2.9   Smokeless tobacco: Never  Vaping Use  Vaping status: Never Used  Substance Use Topics   Alcohol use: Not Currently    Comment: Infrequently.   Drug use: Not Currently    Types: Marijuana    Comment: 11/21, not since found out     Allergies   Latex   Review of Systems Review of Systems  Respiratory:  Positive for cough.    As per HPI   Physical Exam Triage Vital Signs ED Triage Vitals  Encounter Vitals Group     BP      Girls Systolic BP Percentile      Girls Diastolic BP Percentile      Boys Systolic BP Percentile      Boys Diastolic BP Percentile      Pulse      Resp      Temp      Temp src      SpO2      Weight      Height      Head Circumference      Peak Flow      Pain Score      Pain Loc      Pain Education      Exclude from Growth Chart    No data found.  Updated Vital Signs BP 110/76 (BP Location: Right Arm)   Pulse 75   Temp 99 F (37.2 C) (Oral)   Resp 18    Ht 5' 6 (1.676 m)   Wt 165 lb (74.8 kg)   LMP 09/04/2024 (Exact Date)   SpO2 98%   Breastfeeding No   BMI 26.63 kg/m   Physical Exam Vitals and nursing note reviewed.  Constitutional:      General: She is not in acute distress.    Appearance: She is not ill-appearing, toxic-appearing or diaphoretic.  HENT:     Right Ear: Tympanic membrane and ear canal normal.     Left Ear: Tympanic membrane and ear canal normal.     Nose: No rhinorrhea.     Mouth/Throat:     Mouth: Mucous membranes are moist.     Pharynx: Oropharynx is clear. No posterior oropharyngeal erythema.  Eyes:     Conjunctiva/sclera: Conjunctivae normal.  Cardiovascular:     Rate and Rhythm: Normal rate and regular rhythm.     Pulses: Normal pulses.     Heart sounds: Normal heart sounds.  Pulmonary:     Effort: Pulmonary effort is normal. No respiratory distress.     Breath sounds: Normal breath sounds. No wheezing, rhonchi or rales.     Comments: Speaks in full sentences. Takes shallow breaths for exam. Discomfort with deep breath. Limited exam - clear lungs from what I can hear Abdominal:     Palpations: Abdomen is soft.     Tenderness: There is no abdominal tenderness.  Musculoskeletal:     Cervical back: Normal range of motion.  Lymphadenopathy:     Cervical: No cervical adenopathy.  Skin:    General: Skin is warm and dry.  Neurological:     Mental Status: She is alert and oriented to person, place, and time.     UC Treatments / Results  Labs (all labs ordered are listed, but only abnormal results are displayed) Labs Reviewed  POC COVID19/FLU A&B COMBO    EKG  Radiology DG Chest 2 View Result Date: 09/04/2024 CLINICAL DATA:  Chest tightness and shortness of breath. EXAM: CHEST - 2 VIEW COMPARISON:  Chest x-ray 08/16/2020 FINDINGS: The heart size and mediastinal contours  are within normal limits. Both lungs are clear. The visualized skeletal structures are unremarkable. IMPRESSION: No active  cardiopulmonary disease. Electronically Signed   By: Greig Pique M.D.   On: 09/04/2024 18:20    Procedures Procedures (including critical care time)  Medications Ordered in UC Medications  acetaminophen  (TYLENOL ) tablet 975 mg (975 mg Oral Given 09/04/24 1730)  ipratropium-albuterol  (DUONEB) 0.5-2.5 (3) MG/3ML nebulizer solution 3 mL (3 mLs Nebulization Given 09/04/24 1731)    Initial Impression / Assessment and Plan / UC Course  I have reviewed the triage vital signs and the nursing notes.  Pertinent labs & imaging results that were available during my care of the patient were reviewed by me and considered in my medical decision making (see chart for details).  Afebrile, clear lungs on exam Tylenol  dose given in clinic for discomfort, body aches Rapid covid and flu negative DuoNeb given without any reported improvement in chest tightness. Chest xray negative. Images independently reviewed by me, agree with radiology interpretation. Discussion with patient. Treat for asthma exacerbation with prednisone burst, albuterol  inhaler TID. Promethazine  DM for cough. Advised strict ED precautions. Patient verbalized understanding  Final Clinical Impressions(s) / UC Diagnoses   Final diagnoses:  Acute cough  Mild intermittent asthma with acute exacerbation     Discharge Instructions      Xray today is negative. Covid/flu are also negative.   Please use albuterol  inhaler 3 times daily (every 6 hours) for the next several days. Then continue as needed.  Prednisone 40 mg daily for the next 5 days  The promethazine  DM cough syrup can be used up to 4 times daily. If this medication makes you drowsy, take only once before bed.  Please call your primary care provider for follow-up Please go to the emergency department if symptoms worsen.     ED Prescriptions     Medication Sig Dispense Auth. Provider   albuterol  (VENTOLIN  HFA) 108 (90 Base) MCG/ACT inhaler Inhale 2 puffs into  the lungs every 6 (six) hours as needed for wheezing or shortness of breath. 8 g Ebonique Hallstrom, PA-C   promethazine -dextromethorphan (PROMETHAZINE -DM) 6.25-15 MG/5ML syrup Take 5 mLs by mouth 4 (four) times daily as needed for cough. 240 mL Taylah Dubiel, PA-C   predniSONE (DELTASONE) 20 MG tablet Take 2 tablets (40 mg total) by mouth daily with breakfast for 5 days. 10 tablet Don Giarrusso, [place], PA-C      PDMP not reviewed this encounter.   Jeryl Asberry, [place] 09/04/24 [id number]

## 2024-09-04 NOTE — Discharge Instructions (Signed)
 Xray today is negative. Covid/flu are also negative.   Please use albuterol  inhaler 3 times daily (every 6 hours) for the next several days. Then continue as needed.  Prednisone 40 mg daily for the next 5 days  The promethazine  DM cough syrup can be used up to 4 times daily. If this medication makes you drowsy, take only once before bed.  Please call your primary care provider for follow-up Please go to the emergency department if symptoms worsen.

## 2024-09-04 NOTE — ED Triage Notes (Signed)
 Pt presents with complaints of cough, SOB, generalized body aches, and fevers. Today is day three of symptoms. Currently rates overall pain a 6/10. OTC Nyquil + Mucinex  taken for symptoms with little improvement. Symptoms become worse at nighttime. Denies sick contacts. Does endorse constant tightness in chest.

## 2024-09-04 NOTE — ED Notes (Addendum)
 Pt states she is not feeling any better following breathing treatment. Asberry PA made aware.

## 2024-09-08 ENCOUNTER — Ambulatory Visit (INDEPENDENT_AMBULATORY_CARE_PROVIDER_SITE_OTHER): Payer: Self-pay | Admitting: Family Medicine

## 2024-09-08 ENCOUNTER — Other Ambulatory Visit (HOSPITAL_COMMUNITY)
Admission: RE | Admit: 2024-09-08 | Discharge: 2024-09-08 | Disposition: A | Discharge status: Home / Self Care | Payer: Self-pay | Source: Ambulatory Visit | Attending: Family Medicine | Admitting: Family Medicine

## 2024-09-08 ENCOUNTER — Encounter: Payer: Self-pay | Admitting: Family Medicine

## 2024-09-08 VITALS — BP 117/79 | HR 76 | Ht 66.0 in | Wt 168.5 lb

## 2024-09-08 DIAGNOSIS — N898 Other specified noninflammatory disorders of vagina: Secondary | ICD-10-CM | POA: Insufficient documentation

## 2024-09-08 NOTE — Patient Instructions (Signed)
 I will reach out on MyChart with your results. If any treatments are needed, I will let you know and send the medications to your pharmacy.

## 2024-09-08 NOTE — Progress Notes (Deleted)
 Same partner for 19 y rs.    Reports vaginal odor for about 1 year. IT comes and goes. Denies dysuria, vaginal itching.  Also reports 1yrs of increase vaginal discharge, which seems more milky now.   Has laos had 2

## 2024-09-08 NOTE — Progress Notes (Signed)
    SUBJECTIVE:   CHIEF COMPLAINT / HPI:   LK is a 33yo F that pf vaginal odor.  - Reports about 1  of vaginal odor. It comes and goes. Denies dysuria, vaginal itching. - Also reports 15yrs of increase vaginal discharge, which seems more milky now. She reports it changed when she was pregnanct with her son, but she is worried that it hasn't returned back to normal yet. - Period started Monday, is currently still on period. Gets monthly periods, but sometimes has 2 periods within 1 month.  - She is not on contraception. She is open to getting pregnant if it happens.  - Has been with same partner for 13 yrs.  PERTINENT  PMH / PSH: IDA  OBJECTIVE:   Ht 5' 6 (1.676 m)   Wt 168 lb 8 oz (76.4 kg)   LMP 09/04/2024 (Exact Date)   BMI 27.20 kg/m   General: Alert, pleasant woman. NAD. HEENT: NCAT. MMM. CV: RRR, no murmurs.   Resp: CTAB, no wheezing or crackles. Normal WOB on RA.  Abm: Soft, nontender, nondistended. BS present. Ext: Moves all ext spontaneously Skin: Warm, well perfused   ASSESSMENT/PLAN:   Assessment & Plan Vaginal discharge Will check for yeast, BV, GC/Ch, RPR, and HIV. Vaginal swab colelcted by Dr. Delores with Nelson present as chaperone. Will f/u results of swab and blood work.      Twyla Nearing, MD [facility]

## 2024-09-12 ENCOUNTER — Ambulatory Visit: Payer: Self-pay | Admitting: Family Medicine

## 2024-09-12 LAB — CERVICOVAGINAL ANCILLARY ONLY
Bacterial Vaginitis (gardnerella): POSITIVE — AB
Candida Glabrata: NEGATIVE
Candida Vaginitis: NEGATIVE
Chlamydia: NEGATIVE
Comment: NEGATIVE
Comment: NEGATIVE
Comment: NEGATIVE
Comment: NEGATIVE
Comment: NEGATIVE
Comment: NORMAL
Neisseria Gonorrhea: NEGATIVE
Trichomonas: POSITIVE — AB

## 2024-09-12 MED ORDER — METRONIDAZOLE 500 MG PO TABS: 500.0000 mg | ORAL_TABLET | Freq: Two times a day (BID) | ORAL | 1 refills | Status: AC

## 2024-09-12 NOTE — Telephone Encounter (Signed)
 Called pt and informed of BV and trichomonas infection. Sent flagyl  500mg  BID x7days. Also provided 1 refill for partner treatment. No questions or concerns from patient.

## 2024-09-14 ENCOUNTER — Other Ambulatory Visit: Payer: Self-pay

## 2024-11-07 ENCOUNTER — Ambulatory Visit
Admission: RE | Admit: 2024-11-07 | Discharge: 2024-11-07 | Disposition: A | Discharge status: Home / Self Care | Payer: Self-pay | Source: Ambulatory Visit | Attending: Emergency Medicine | Admitting: Emergency Medicine

## 2024-11-07 VITALS — BP 105/67 | HR 68 | Temp 98.6°F | Resp 16

## 2024-11-07 DIAGNOSIS — R112 Nausea with vomiting, unspecified: Secondary | ICD-10-CM

## 2024-11-07 DIAGNOSIS — A084 Viral intestinal infection, unspecified: Secondary | ICD-10-CM

## 2024-11-07 DIAGNOSIS — R1084 Generalized abdominal pain: Secondary | ICD-10-CM

## 2024-11-07 DIAGNOSIS — R197 Diarrhea, unspecified: Secondary | ICD-10-CM

## 2024-11-07 LAB — POCT URINE PREGNANCY: Preg Test, Ur: NEGATIVE

## 2024-11-07 LAB — POCT URINE DIPSTICK
Bilirubin, UA: NEGATIVE
Blood, UA: NEGATIVE
Glucose, UA: NEGATIVE mg/dL
Leukocytes, UA: NEGATIVE
Nitrite, UA: NEGATIVE
Spec Grav, UA: >=1.03 — AB
Urobilinogen, UA: 2 U/dL — AB
pH, UA: 7

## 2024-11-07 MED ORDER — ONDANSETRON 4 MG PO TBDP: 4.0000 mg | ORAL_TABLET | Freq: Three times a day (TID) | ORAL | 0 refills | Status: AC | PRN

## 2024-11-07 MED ORDER — DICYCLOMINE HCL 20 MG PO TABS: 20.0000 mg | ORAL_TABLET | Freq: Two times a day (BID) | ORAL | 0 refills | Status: AC

## 2024-11-07 NOTE — ED Triage Notes (Signed)
 Pt c/o abdominal pain, diarrhea, nausea, and vomiting since yesterday.

## 2024-11-07 NOTE — Discharge Instructions (Addendum)
 I discussed I believe her symptoms are likely related to a viral gastroenteritis, or stomach virus. You can take Zofran  every 8 hours as needed for nausea and vomiting.  This will dissolve under your tongue. You can take Bentyl  every 12 hours as needed for abdominal cramping. You could also try taking over-the-counter Imodium  for diarrhea if needed.  You can take this 4 times daily. Otherwise you can take 650 mg of Tylenol  as needed for abdominal pain if needed. Make sure you are staying hydrated and getting plenty of rest. Follow-up with your primary care provider or return here as needed.  If you develop worsening abdominal pain, excessive vomiting, excessive diarrhea, blood in vomit or stool, high fevers, or severe weakness please seek any medical treatment in the emergency department.

## 2024-11-07 NOTE — ED Provider Notes (Addendum)
 " [facility]    CSN: [id number] Arrival date & time: 11/07/24  1455      History   Chief Complaint Chief Complaint  Patient presents with   Abdominal Pain    I think I have a virus keep using the bathroom and throwing up - Entered by patient    HPI Samantha Hardin is a 34 y.o. female.   Patient presents with generalized abdominal discomfort, nausea, vomiting, and diarrhea that began on 1/11.  Patient denies any known fever, body aches, and chills.  Patient also denies any blood in her vomit or stool.  Patient reports the abdominal discomfort comes and goes and worsens with eating.  Patient reports that her son is sick with similar symptoms.  Patient denies taking medication for her symptoms.  Patient reports that she has vomited approximately 4 times in the last 24 hours and had approximately 4 episodes of diarrhea in the last 24 hours.  Denies any history of abdominal surgeries other than cesarean section.  LMP 1/4.  The history is provided by the patient and medical records.  Abdominal Pain   Past Medical History:  Diagnosis Date   Anemia    Asthma    Depression    never on meds, doing ok   Headache(784.0)    when on birth control   Heart murmur    Migraines    Trichomonas infection     Patient Active Problem List   Diagnosis Date Noted   Supervision of low-risk pregnancy, first trimester 10/10/2021   Iron deficiency anemia due to chronic blood loss 09/26/2019   Rh negative, antepartum 05/27/2016    Past Surgical History:  Procedure Laterality Date   CESAREAN SECTION N/A 09/14/2016   Procedure: CESAREAN SECTION;  Surgeon: Lang JINNY Peel, DO;  Location: [facility] BIRTHING SUITES;  Service: Obstetrics;  Laterality: N/A;   INDUCED ABORTION     PERINEAL LACERATION REPAIR N/A 09/14/2016   Procedure: CONARD BALLOON PLACEMENT;  Surgeon: Lang JINNY Peel, DO;  Location: [facility]THING SUIT[facility];  Service: Obstetrics;  Laterality: N/A;    OB History     Gravida  4    Para  1   Term  1   Preterm  0   AB  2   Living  1      SAB  0   IAB  2   Ectopic  0   Multiple  0   Live Births  1            Home Medications    Prior to Admission medications  Medication Sig Start Date End Date Taking? Authorizing Provider  dicyclomine  (BENTYL ) 20 MG tablet Take 1 tablet (20 mg total) by mouth 2 (two) times daily. 11/07/24  Yes Johnie, Pharoah Goggins A, NP  ondansetron  (ZOFRAN -ODT) 4 MG disintegrating tablet Take 1 tablet (4 mg total) by mouth every 8 (eight) hours as needed for nausea or vomiting. 11/07/24  Yes Johnie Flaming A, NP  albuterol  (VENTOLIN  HFA) 108 (90 Base) MCG/ACT inhaler Inhale 2 puffs into the lungs every 6 (six) hours as needed for wheezing or shortness of breath. 09/04/24   Rising, Asberry, PA-C  promethazine -dextromethorphan (PROMETHAZINE -DM) 6.25-15 MG/5ML syrup Take 5 mLs by mouth 4 (four) times daily as needed for cough. 09/04/24   Rising, Asberry, PA-C  cetirizine  (ZYRTEC ) 10 MG tablet Take 1 tablet (10 mg total) by mouth daily. 09/19/18 12/07/19  Adah Wilbert LABOR, FNP  famotidine  (PEPCID ) 20 MG tablet Take 1 tablet (20 mg  total) by mouth 2 (two) times daily. 03/25/19 12/07/19  Mario Million, MD    Family History Family History  Problem Relation Age of Onset   Heart disease Maternal Grandfather    Diabetes Maternal Grandfather    Hypertension Maternal Grandfather    Healthy Mother    Healthy Father    Anesthesia problems Neg Hx     Social History Social History[1]   Allergies   Latex   Review of Systems Review of Systems  Gastrointestinal:  Positive for abdominal pain.   Per HPI  Physical Exam Triage Vital Signs ED Triage Vitals  Encounter Vitals Group     BP 11/07/24 1501 105/67     Girls Systolic BP Percentile --      Girls Diastolic BP Percentile --      Boys Systolic BP Percentile --      Boys Diastolic BP Percentile --      Pulse Rate 11/07/24 1501 68     Resp 11/07/24 1501 16     Temp 11/07/24 1501  98.6 F (37 C)     Temp Source 11/07/24 1501 Oral     SpO2 11/07/24 1501 98 %     Weight --      Height --      Head Circumference --      Peak Flow --      Pain Score 11/07/24 1504 7     Pain Loc --      Pain Education --      Exclude from Growth Chart --    No data found.  Updated Vital Signs BP 105/67 (BP Location: Right Arm)   Pulse 68   Temp 98.6 F (37 C) (Oral)   Resp 16   LMP 10/29/2024   SpO2 98%   Visual Acuity Right Eye Distance:   Left Eye Distance:   Bilateral Distance:    Right Eye Near:   Left Eye Near:    Bilateral Near:     Physical Exam Vitals and nursing note reviewed.  Constitutional:      General: She is awake. She is not in acute distress.    Appearance: Normal appearance. She is well-developed and well-groomed. She is not ill-appearing.  Cardiovascular:     Rate and Rhythm: Normal rate and regular rhythm.  Pulmonary:     Effort: Pulmonary effort is normal.     Breath sounds: Normal breath sounds.  Abdominal:     General: Abdomen is flat. Bowel sounds are normal. There is no distension.     Palpations: Abdomen is soft. There is no mass.     Tenderness: There is generalized abdominal tenderness. There is no right CVA tenderness, left CVA tenderness, guarding or rebound. Negative signs include Murphy's sign, Rovsing's sign and McBurney's sign.     Hernia: No hernia is present.     Comments: Tenderness noted to generalized abdomen.  Skin:    General: Skin is warm and dry.  Neurological:     General: No focal deficit present.     Mental Status: She is alert and oriented to person, place, and time. Mental status is at baseline.  Psychiatric:        Behavior: Behavior is cooperative.      UC Treatments / Results  Labs (all labs ordered are listed, but only abnormal results are displayed) Labs Reviewed  POCT URINE DIPSTICK - Abnormal; Notable for the following components:      Result Value   Ketones, POC UA trace (  5) (*)    Spec Grav,  UA >=1.030 (*)    Urobilinogen, UA 2.0 (*)    All other components within normal limits  POCT URINE PREGNANCY    EKG   Radiology No results found.  Procedures Procedures (including critical care time)  Medications Ordered in UC Medications - No data to display  Initial Impression / Assessment and Plan / UC Course  I have reviewed the triage vital signs and the nursing notes.  Pertinent labs & imaging results that were available during my care of the patient were reviewed by me and considered in my medical decision making (see chart for details).     Patient is overall well-appearing.  Vitals are stable.  Generalized tenderness noted to abdomen without guarding or rebound tenderness.  UPT negative, urinalysis unremarkable.  Suspect symptoms are likely related to a viral gastroenteritis.  Prescribed Zofran  as needed for nausea and vomiting.  Prescribed Bentyl  as needed for abdominal cramping.  Discussed over-the-counter medications for symptoms.  Discussed importance of hydration.  Discussed follow-up, return, and strict ER precautions. Final Clinical Impressions(s) / UC Diagnoses   Final diagnoses:  Nausea, vomiting, and diarrhea  Generalized abdominal discomfort  Viral gastroenteritis     Discharge Instructions      I discussed I believe her symptoms are likely related to a viral gastroenteritis, or stomach virus. You can take Zofran  every 8 hours as needed for nausea and vomiting.  This will dissolve under your tongue. You can take Bentyl  every 12 hours as needed for abdominal cramping. You could also try taking over-the-counter Imodium  for diarrhea if needed.  You can take this 4 times daily. Otherwise you can take 650 mg of Tylenol  as needed for abdominal pain if needed. Make sure you are staying hydrated and getting plenty of rest. Follow-up with your primary care provider or return here as needed.  If you develop worsening abdominal pain, excessive vomiting,  excessive diarrhea, blood in vomit or stool, high fevers, or severe weakness please seek any medical treatment in the emergency department.    ED Prescriptions     Medication Sig Dispense Auth. Provider   ondansetron  (ZOFRAN -ODT) 4 MG disintegrating tablet Take 1 tablet (4 mg total) by mouth every 8 (eight) hours as needed for nausea or vomiting. 10 tablet Johnie Flaming A, NP   dicyclomine  (BENTYL ) 20 MG tablet Take 1 tablet (20 mg total) by mouth 2 (two) times daily. 20 tablet Johnie Flaming A, NP      PDMP not reviewed this encounter.    Johnie Flaming A, NP 11/07/24 1528     [1]  Social History Tobacco Use   Smoking status: Former    Current packs/day: 0.00    Average packs/day: 0.3 packs/day    Types: Cigarettes, Cigars    Quit date: 09/15/2021    Years since quitting: 3.1   Smokeless tobacco: Never  Vaping Use   Vaping status: Never Used  Substance Use Topics   Alcohol use: Not Currently    Comment: Infrequently.   Drug use: Not Currently    Types: Marijuana    Comment: 11/21, not since found out     Johnie Flaming LABOR, NP 11/07/24 1529  "
